# Patient Record
Sex: Female | Born: 1955 | Race: White | Hispanic: No | Marital: Married | Smoking: Never smoker
Health system: Southern US, Community
[De-identification: ages and names within clinical notes are randomized; demographics above are authoritative.]

## PROBLEM LIST (undated history)

## (undated) DIAGNOSIS — F32A Depression, unspecified: Secondary | ICD-10-CM

## (undated) DIAGNOSIS — F329 Major depressive disorder, single episode, unspecified: Secondary | ICD-10-CM

## (undated) DIAGNOSIS — I10 Essential (primary) hypertension: Secondary | ICD-10-CM

## (undated) DIAGNOSIS — T148XXA Other injury of unspecified body region, initial encounter: Secondary | ICD-10-CM

## (undated) DIAGNOSIS — H539 Unspecified visual disturbance: Secondary | ICD-10-CM

## (undated) HISTORY — DX: Unspecified visual disturbance: H53.9

## (undated) HISTORY — PX: CERVICAL FUSION: SHX112

## (undated) HISTORY — PX: REPLACEMENT TOTAL KNEE BILATERAL: SUR1225

---

## 2002-12-17 ENCOUNTER — Emergency Department (HOSPITAL_COMMUNITY): Admission: EM | Admit: 2002-12-17 | Discharge: 2002-12-17 | Payer: Self-pay

## 2006-11-04 ENCOUNTER — Encounter: Admission: RE | Admit: 2006-11-04 | Discharge: 2006-11-04 | Payer: Self-pay | Admitting: Family Medicine

## 2006-11-20 ENCOUNTER — Encounter: Admission: RE | Admit: 2006-11-20 | Discharge: 2006-11-20 | Payer: Self-pay | Admitting: Family Medicine

## 2007-01-09 ENCOUNTER — Observation Stay (HOSPITAL_COMMUNITY): Admission: RE | Admit: 2007-01-09 | Discharge: 2007-01-10 | Payer: Self-pay | Admitting: Neurosurgery

## 2007-01-28 ENCOUNTER — Encounter: Admission: RE | Admit: 2007-01-28 | Discharge: 2007-01-28 | Payer: Self-pay | Admitting: Neurosurgery

## 2007-04-14 ENCOUNTER — Encounter: Admission: RE | Admit: 2007-04-14 | Discharge: 2007-04-14 | Payer: Self-pay | Admitting: Endocrinology

## 2007-07-18 ENCOUNTER — Encounter: Admission: RE | Admit: 2007-07-18 | Discharge: 2007-07-18 | Payer: Self-pay | Admitting: Neurosurgery

## 2007-11-09 ENCOUNTER — Emergency Department (HOSPITAL_COMMUNITY): Admission: EM | Admit: 2007-11-09 | Discharge: 2007-11-09 | Payer: Self-pay | Admitting: Emergency Medicine

## 2007-11-26 ENCOUNTER — Encounter
Admission: RE | Admit: 2007-11-26 | Discharge: 2007-11-26 | Payer: Self-pay | Admitting: Physical Medicine and Rehabilitation

## 2007-12-01 ENCOUNTER — Encounter
Admission: RE | Admit: 2007-12-01 | Discharge: 2007-12-01 | Payer: Self-pay | Admitting: Physical Medicine and Rehabilitation

## 2008-09-13 ENCOUNTER — Encounter: Admission: RE | Admit: 2008-09-13 | Discharge: 2008-09-13 | Payer: Self-pay | Admitting: Endocrinology

## 2009-03-31 ENCOUNTER — Encounter: Admission: RE | Admit: 2009-03-31 | Discharge: 2009-03-31 | Payer: Self-pay | Admitting: Endocrinology

## 2009-05-02 ENCOUNTER — Encounter: Admission: RE | Admit: 2009-05-02 | Discharge: 2009-05-02 | Payer: Self-pay | Admitting: Internal Medicine

## 2009-06-06 ENCOUNTER — Encounter: Admission: RE | Admit: 2009-06-06 | Discharge: 2009-06-06 | Payer: Self-pay | Admitting: Internal Medicine

## 2010-04-10 ENCOUNTER — Encounter: Admission: RE | Admit: 2010-04-10 | Discharge: 2010-04-10 | Payer: Self-pay | Admitting: Endocrinology

## 2010-08-07 ENCOUNTER — Ambulatory Visit
Admission: RE | Admit: 2010-08-07 | Discharge: 2010-08-07 | Disposition: A | Discharge status: Home / Self Care | Payer: 59 | Source: Ambulatory Visit | Attending: Physical Medicine and Rehabilitation | Admitting: Physical Medicine and Rehabilitation

## 2010-08-07 ENCOUNTER — Other Ambulatory Visit: Payer: Self-pay | Admitting: Physical Medicine and Rehabilitation

## 2010-08-07 DIAGNOSIS — M25561 Pain in right knee: Secondary | ICD-10-CM

## 2010-11-07 NOTE — Op Note (Signed)
NAMELASHINA, Shelley Lloyd                  ACCOUNT NO.:  [id number]   MEDICAL RECORD NO.:  [id number]          PATIENT TYPE:  INP   LOCATION:  2899                         FACILITY:  [facility]   PHYSICIAN:  Reinaldo Meeker, M.D. DATE OF BIRTH:  1956/04/23   DATE OF PROCEDURE:  01/09/2007  DATE OF DISCHARGE:                               OPERATIVE REPORT   PREOPERATIVE DIAGNOSIS:  Herniated disk C4-5, left.   POSTOPERATIVE DIAGNOSIS:  Herniated disk C4-5, left.   PROCEDURE:  C4-5 anterior cervical diskectomy with bone bank fusion  followed by Nuvasive helix anterior cervical plating.   SURGEON:  Reinaldo Meeker, M.D.   ASSISTANT:  Dr. Julio Sicks.   PROCEDURE IN DETAIL:  Having been placed in the supine position in five  pounds halter traction, the patient's neck was prepped and draped in  usual sterile fashion.  Localizing fluoroscopy was used prior to  incision to identify the appropriate level.  Transverse incision was  made in the right anterior neck starting in the midline, headed towards  the medial aspect of the sternocleidomastoid muscle.  The platysma  muscle was incised transversely.  The natural fascial plane between the  strap muscles medially and sternocleidomastoid laterally was identified  and followed down to the anterior aspect of cervical spine.  Longus  colli muscles were identified, split in the midline, stripped away  bilaterally with the Optician, dispensing.  Self-retaining  retractor was placed for exposure.  X-rays showed approach to the  appropriate level.  Using Leksell rongeur, the bony overgrowth across  the disk space was removed.  Disk was then incised with the 15 blade.  Using pituitary rongeurs and curettes approximately 90% of disk material  was removed.  High-speed drill used to widen the interspace as well.  Microscope was draped, brought into the field, used for remainder of the  case.  Using microsection technique the remainder of disk  material bony,  overgrowth was removed down to the post longitudinal ligament.  The  ligament was then incised.  The cut edges were removed with the Kerrison  punch.  Thorough decompression was then carried out of the dural sac as  well as the proximal nerve roots, particularly on the left symptomatic  side.  At this time, inspection was carried out in all directions for  any evidence of residual compression and none could be identified.  Irrigation was carried out.  Measurements were taken and 7 mm bone bank  plugs reconstituted.  After irrigating once more and confirming  hemostasis, the plugs impacted without difficulty and fluoroscopy showed  them to be in good position.  Large amounts irrigation were then carried  out.  An appropriate length Helix anterior cervical plate was then  chosen.  Under fluoroscopic guidance drill holes were placed followed by  placement of 13 mm screws x4.  Final fluoroscopy showed the bony spacer,  plate, screws all be in good position.  Large amounts of irrigation were  carried out once more, any bleeding controlled with bipolar coagulation.  The wound was then closed with interrupted Vicryl  on the platysma  muscle, inverted 5-0 PDS on the subcuticular layer.  Steri-Strips on the  skin.  Sterile dressing was then applied and the patient was extubated,  taken to recovery room in stable condition.           ______________________________  Reinaldo Meeker, M.D.     ROK/MEDQ  D:  01/09/2007  T:  01/10/2007  Job:  [id number]

## 2010-11-28 ENCOUNTER — Other Ambulatory Visit: Payer: Self-pay | Admitting: Physical Medicine and Rehabilitation

## 2010-11-28 DIAGNOSIS — M542 Cervicalgia: Secondary | ICD-10-CM

## 2010-12-05 ENCOUNTER — Ambulatory Visit
Admission: RE | Admit: 2010-12-05 | Discharge: 2010-12-05 | Disposition: A | Discharge status: Home / Self Care | Payer: 59 | Source: Ambulatory Visit | Attending: Physical Medicine and Rehabilitation | Admitting: Physical Medicine and Rehabilitation

## 2010-12-05 DIAGNOSIS — M542 Cervicalgia: Secondary | ICD-10-CM

## 2011-04-03 ENCOUNTER — Other Ambulatory Visit (HOSPITAL_COMMUNITY): Payer: Self-pay | Admitting: Orthopedic Surgery

## 2011-04-03 ENCOUNTER — Encounter (HOSPITAL_COMMUNITY)
Admission: RE | Admit: 2011-04-03 | Discharge: 2011-04-03 | Disposition: A | Discharge status: Home / Self Care | Payer: 59 | Source: Ambulatory Visit | Attending: Orthopedic Surgery | Admitting: Orthopedic Surgery

## 2011-04-03 DIAGNOSIS — M1710 Unilateral primary osteoarthritis, unspecified knee: Secondary | ICD-10-CM

## 2011-04-03 LAB — URINALYSIS, ROUTINE W REFLEX MICROSCOPIC
Bilirubin Urine: NEGATIVE
Hgb urine dipstick: NEGATIVE
Ketones, ur: NEGATIVE mg/dL
Protein, ur: NEGATIVE mg/dL
Urobilinogen, UA: 0.2 mg/dL (ref 0.0–1.0)

## 2011-04-03 LAB — COMPREHENSIVE METABOLIC PANEL
ALT: 63 U/L — ABNORMAL HIGH (ref 0–35)
Albumin: 4.1 g/dL (ref 3.5–5.2)
Alkaline Phosphatase: 82 U/L (ref 39–117)
BUN: 17 mg/dL (ref 6–23)
Calcium: 9.6 mg/dL (ref 8.4–10.5)
Potassium: 4.7 mEq/L (ref 3.5–5.1)
Sodium: 139 mEq/L (ref 135–145)
Total Protein: 7.4 g/dL (ref 6.0–8.3)

## 2011-04-03 LAB — DIFFERENTIAL
Basophils Relative: 1 % (ref 0–1)
Eosinophils Relative: 2 % (ref 0–5)
Lymphocytes Relative: 30 % (ref 12–46)
Lymphs Abs: 2.1 10*3/uL (ref 0.7–4.0)
Monocytes Absolute: 0.7 10*3/uL (ref 0.1–1.0)
Monocytes Relative: 9 % (ref 3–12)
Neutro Abs: 4.1 10*3/uL (ref 1.7–7.7)
Neutrophils Relative %: 58 % (ref 43–77)

## 2011-04-03 LAB — CBC
HCT: 40.5 % (ref 36.0–46.0)
MCH: 30.3 pg (ref 26.0–34.0)
MCHC: 33.3 g/dL (ref 30.0–36.0)
MCV: 90.8 fL (ref 78.0–100.0)
RDW: 12.8 % (ref 11.5–15.5)

## 2011-04-03 LAB — SURGICAL PCR SCREEN: MRSA, PCR: NEGATIVE

## 2011-04-03 LAB — ABO/RH: ABO/RH(D): A POS

## 2011-04-04 LAB — URINE CULTURE: Culture: NO GROWTH

## 2011-04-09 ENCOUNTER — Inpatient Hospital Stay (HOSPITAL_COMMUNITY)
Admission: RE | Admit: 2011-04-09 | Discharge: 2011-04-12 | DRG: 462 | Disposition: A | Discharge status: Skilled Nursing Facility | Payer: 59 | Source: Ambulatory Visit | Attending: Orthopedic Surgery | Admitting: Orthopedic Surgery

## 2011-04-09 DIAGNOSIS — IMO0002 Reserved for concepts with insufficient information to code with codable children: Principal | ICD-10-CM | POA: Diagnosis present

## 2011-04-09 DIAGNOSIS — M1710 Unilateral primary osteoarthritis, unspecified knee: Principal | ICD-10-CM | POA: Diagnosis present

## 2011-04-09 DIAGNOSIS — D62 Acute posthemorrhagic anemia: Secondary | ICD-10-CM | POA: Diagnosis not present

## 2011-04-09 DIAGNOSIS — Z01818 Encounter for other preprocedural examination: Secondary | ICD-10-CM

## 2011-04-09 DIAGNOSIS — Z01812 Encounter for preprocedural laboratory examination: Secondary | ICD-10-CM

## 2011-04-09 DIAGNOSIS — B1910 Unspecified viral hepatitis B without hepatic coma: Secondary | ICD-10-CM | POA: Diagnosis present

## 2011-04-10 LAB — BASIC METABOLIC PANEL
BUN: 13
Chloride: 103
GFR calc Af Amer: >60
GFR calc non Af Amer: >60
Potassium: 3.9
Sodium: 138

## 2011-04-10 LAB — CBC
Hemoglobin: 8.8 g/dL — ABNORMAL LOW (ref 12.0–15.0)
Hemoglobin: 13.4
MCH: 29.5 pg (ref 26.0–34.0)
MCHC: 32.1 g/dL (ref 30.0–36.0)
MCHC: 34
MCV: 91.9 fL (ref 78.0–100.0)
RBC: 2.98 MIL/uL — ABNORMAL LOW (ref 3.87–5.11)
RDW: 12.5
WBC: 7.3

## 2011-04-10 LAB — COMPREHENSIVE METABOLIC PANEL
BUN: 6 mg/dL (ref 6–23)
CO2: 27 mEq/L (ref 19–32)
Calcium: 8 mg/dL — ABNORMAL LOW (ref 8.4–10.5)
Creatinine, Ser: 0.77 mg/dL (ref 0.50–1.10)
GFR calc Af Amer: >90 mL/min (ref >90)
GFR calc non Af Amer: >90 mL/min (ref >90)
Glucose, Bld: 109 mg/dL — ABNORMAL HIGH (ref 70–99)
Sodium: 137 mEq/L (ref 135–145)
Total Protein: 5.3 g/dL — ABNORMAL LOW (ref 6.0–8.3)

## 2011-04-11 LAB — COMPREHENSIVE METABOLIC PANEL
ALT: 21 U/L (ref 0–35)
AST: 24 U/L (ref 0–37)
Albumin: 2.4 g/dL — ABNORMAL LOW (ref 3.5–5.2)
CO2: 24 mEq/L (ref 19–32)
Calcium: 8 mg/dL — ABNORMAL LOW (ref 8.4–10.5)
GFR calc non Af Amer: 80 mL/min — ABNORMAL LOW (ref >90)
Sodium: 139 mEq/L (ref 135–145)

## 2011-04-11 LAB — CBC
MCH: 29.9 pg (ref 26.0–34.0)
Platelets: 97 10*3/uL — ABNORMAL LOW (ref 150–400)
RBC: 2.61 MIL/uL — ABNORMAL LOW (ref 3.87–5.11)
WBC: 9.2 10*3/uL (ref 4.0–10.5)

## 2011-04-12 LAB — TYPE AND SCREEN
Antibody Screen: NEGATIVE
Unit division: 0
Unit division: 0

## 2011-04-12 LAB — CBC
HCT: 28.5 % — ABNORMAL LOW (ref 36.0–46.0)
Hemoglobin: 9.5 g/dL — ABNORMAL LOW (ref 12.0–15.0)
MCH: 30 pg (ref 26.0–34.0)
MCV: 89.9 fL (ref 78.0–100.0)
RBC: 3.17 MIL/uL — ABNORMAL LOW (ref 3.87–5.11)

## 2011-04-12 LAB — COMPREHENSIVE METABOLIC PANEL
BUN: 6 mg/dL (ref 6–23)
CO2: 29 mEq/L (ref 19–32)
Calcium: 8.4 mg/dL (ref 8.4–10.5)
Creatinine, Ser: 0.7 mg/dL (ref 0.50–1.10)
GFR calc Af Amer: >90 mL/min (ref >90)
GFR calc non Af Amer: >90 mL/min (ref >90)
Glucose, Bld: 102 mg/dL — ABNORMAL HIGH (ref 70–99)
Total Bilirubin: 1.2 mg/dL (ref 0.3–1.2)

## 2011-04-19 NOTE — Op Note (Signed)
Shelley Lloyd, Shelley Lloyd NO.:  [id number]  MEDICAL RECORD NO.:  [id number]  LOCATION:  5040                         FACILITY:  [facility]  PHYSICIAN:  Elana Alm. Thurston Hole, M.D. DATE OF BIRTH:  06/24/1956  DATE OF PROCEDURE:  04/09/2011 DATE OF DISCHARGE:                              OPERATIVE REPORT   PREOPERATIVE DIAGNOSES: 1. Right knee degenerative joint disease. 2. Left knee degenerative joint disease.  POSTOPERATIVE DIAGNOSES: 1. Right knee degenerative joint disease. 2. Left knee degenerative joint disease.  PROCEDURE: 1. Right total knee replacement using DePuy cemented total knee system     with #3 cemented femur, #3 cemented tibia with 10-mm polyethylene     RP tibial spacer, and 35-mm polyethylene cemented patella. 2. Left total knee replacement using DePuy cemented total knee system     with #3 cemented femur, #3 cemented tibia with 12.5-mm polyethylene     RP tibial spacer and 35-mm polyethylene cemented patella. 3. Zinacef impregnated cement.  SURGEON:  Elana Alm. Thurston Hole, MD  ASSISTANT:  Julien Girt, PA-C  ANESTHESIA:  General.  OPERATIVE TIME:  Right knee 1 hour and 20 minutes, left knee 1 hour and 20 minutes.  COMPLICATIONS:  None.  DESCRIPTION OF PROCEDURE:  Ms. Weldin was brought to the operating room on April 09, 2011, after femoral nerve blocks were placed in both legs by Anesthesia.  She was placed on operative table in supine position.  She received Ancef 1 g IV preoperatively for prophylaxis.  After being placed under general anesthesia, she had a Foley catheter placed under sterile conditions.  Both knees were examined under anesthesia.  She had range of motion from -5 to 125 degrees bilaterally.  Both knees were stable to ligamentous exam with normal patellar tracking with mild varus deformities.  Both legs were then prepped using sterile DuraPrep and draped using sterile technique.  Time-out procedure was called and  both knees were identified as the correct knees.  Initially, the right knee underwent surgery.  The right leg was exsanguinated and a thigh tourniquet elevated to 365 mm.  Initially, through a 15-cm longitudinal incision based over the patella, initial exposure was made.  The underlying subcutaneous tissues were incised along with skin incision. A median arthrotomy was performed revealing an excessive amount of normal-appearing joint fluid.  The articular surfaces were inspected. She had grade 4 changes medially and in the patellofemoral joint and grade 3 changes laterally.  Osteophytes were removed from the femoral condyles and tibial plateau.  The medial and lateral meniscal remnants were removed as well as the anterior cruciate ligament.  Intramedullary drill was then drilled up the femoral canal for placement of distal femoral cutting jig which was placed in the appropriate manner of rotation and a distal 11-mm cut was made.  The distal femur was incised. #3 was found to be the appropriate size.  A #3 cutting jig was placed in the appropriate manner of external rotation and then these cuts were made.  Proximal tibia was then exposed.  The tibial spines were removed with an oscillating saw.  Intramedullary drill was drilled down the tibial canal for placement of proximal tibial cutting jig,  which was placed in appropriate manner of rotation and a proximal 6-mm cut was made based off the medial or lower side.  Spacer blocks were then placed in flexion and extension.  10-mm blocks gave excellent balancing, excellent stability, and excellent correction of her flexion and varus deformities.  #3 tibial baseplate trial was then placed on the cut tibial surface with an excellent fit and a keel cut was made.  The PCL box cutter was then placed on the distal femur and these cuts were made. At this point, the #3 femoral trial was placed and with a #3 tibial base plate trial and 10-mm  polyethylene RP tibial spacer, the knee was reduced, taken through range of motion from 0-125 degrees with excellent stability and excellent correction of her flexion and varus deformities and normal patellar tracking.  A resurfacing 9-mm cut was then made on the patella and 3 locking holes placed for a 35-mm polyethylene patellar trial, and again patellofemoral tracking was evaluated and found to be normal.  At this point, it is felt that all the trial components were of excellent size, fit, and stability.  They were then removed.  The knee was then jet lavage irrigated with 3 L of saline.  The proximal tibia was then exposed, #3 tibial base plate with Zinacef impregnated cement backing was hammered in position with an excellent fit with excess cement being removed from around the edges.  #3 femoral component with cement backing was hammered into position also with an excellent fit with excess cement being removed from around the edges.  The 10-mm polyethylene RP tibial spacer was placed on tibial base plate.  The knee reduced, taken through a range of motion from 0-125 degrees with excellent stability and excellent correction of her flexion and varus deformities.  The 35-mm polyethylene cement backed patella was then placed in its position and held there with a clamp.  After the cement hardened, again patellofemoral tracking was evaluated and found to be normal.  At this point, it was felt that all the components were of excellent size, fit, and stability.  The wound was further irrigated with saline and then the tourniquet was released.  Hemostasis was obtained with cautery.  The arthrotomy was then closed with #1 Ethibond suture over 2 medium Hemovac drains.  Subcutaneous tissues were closed with 0 and 2-0 Vicryl, subcuticular layer closed with 4-0 Monocryl. Sterile dressings were applied.  At this point, the patient was very stable on this and it was elected to proceed with a left  total knee replacement.  Left knee was exsanguinated and a thigh tourniquet elevated to 365 mm. At this point, a 15- mm longitudinal incision was based over the patella.  The underlying subcutaneous tissues were incised along with skin incision.  A median arthrotomy was performed revealing an excessive amount of normal-appearing joint fluid.  The articular surfaces were inspected.  She had grade 4 changes medially and in the patellofemoral joint and grade 3 changes noted on the lateral femoral condyle. Osteophytes were removed from the femoral condyles and tibial plateau. The medial and lateral meniscal remnants were removed as well as the anterior cruciate ligament.  Intramedullary drill was then drilled up the femoral canal for placement of the distal femoral cutting jig, which was placed in appropriate manner of rotation and a distal 11-mm cut was made.  The distal femur was incised.  #3 was found to be appropriate size.  #3 cutting jig was placed in the appropriate manner of  external rotation and then these cuts were made.  The proximal tibia was then exposed.  The tibial spines were removed with an oscillating saw. Intramedullary drill was then drilled d own the tibial canal for placement of proximal tibial cutting jig, which was placed in the appropriate manner of rotation and a proximal 6-mm cut was made based off the medial or lower side.  Spacer blocks were then placed in flexion and extension.  12.5 mm blocks gave excellent balancing, excellent stability, and excellent correction of her flexion and varus deformities.  #3 tibial baseplate trial was then placed on the cut tibial surface with an excellent fit and the keel cut was made.  The PCL box cutter was then placed on the distal femur and these cuts were made. At this point, with the #3 femoral trial in place, a #3 tibial base plate trial, and a 12.5-mm polyethylene RP tibial spacer, knee was reduced, taken through range of  motion from 0-125 degrees with excellent stability and excellent correction of her flexion and varus deformities and normal patellar tracking.  A resurfacing 9-mm cut was then made on the patella and 3 locking holes placed for a 35-mm polyethylene patellar trial, and again patellofemoral tracking was evaluated and found to be normal.  At this point, it was felt that all trial components were of excellent size, fit, and stability.  They were then removed.  The knee was then jet lavage irrigated with 3 L of saline.  The proximal tibia was then exposed, #3 tibial base plate with Zinacef impregnated cement backing was hammered in position with an excellent fit with excess cement being removed from around the edges.  #3 femoral component with cement backing was hammered in position also with an excellent fit with excess cement being removed from around the edges.  The 12.5-mm polyethylene RP tibial spacer was placed on tibial baseplate, the knee reduced, taken through a range of motion from 0-125 degrees with excellent stability and excellent correction of her flexion and varus deformities.  The 35-mm polyethylene cement backed patella was then placed in its position and held there with a clamp.  After the cement hardened, again patellofemoral tracking was evaluated and found to be normal.  At this point, it was felt that all the components were of excellent size, fit, and stability.  The wound was further irrigated with saline, and the tourniquet was released.  Hemostasis was obtained with cautery.  The arthrotomy was then closed with #1 Ethibond suture over 2 medium Hemovac drains.  Subcutaneous tissues were closed with 0 and 2-0 Vicryl.  Subcuticular layer closed with 4-0 Monocryl.  Sterile dressings were applied.  Long-leg splints were then placed on both legs. She was then awakened, extubated, and taken to the recovery room in stable condition.  Needle and sponge count was correct x2 at  the end of the case.  Neurovascular status normal.  Pulses 2+ and symmetric.     Costella Schwarz A. Thurston Hole, M.D.     RAW/MEDQ  D:  04/09/2011  T:  04/10/2011  Job:  [id number]  Electronically Signed by Salvatore Marvel M.D. on 04/19/2011 11:47:07 AM

## 2011-04-19 NOTE — Discharge Summary (Signed)
Shelley Lloyd, Shelley Lloyd NO.:  [id number]  MEDICAL RECORD NO.:  [id number]  LOCATION:  5040                         FACILITY:  [facility]  PHYSICIAN:  Elana Alm. Thurston Hole, M.D. DATE OF BIRTH:  [birth year]  DATE OF ADMISSION:  04/09/2011 DATE OF DISCHARGE:  04/12/2011                        DISCHARGE SUMMARY - REFERRING   ADMITTING DIAGNOSES: 1. End-stage degenerative joint disease both knees. 2. Hepatitis B. 3. Status post neck surgery x3 with a history of chronic pain.  DISCHARGE DIAGNOSES: 1. End-stage degenerative joint disease of both knees, status post     bilateral total knee replacements. 2. Acute blood loss anemia, status post bilateral Autovac transfusions     and 2 units of packed red blood cell transfusion. 3. Status post neck surgery x3. 4. Hepatitis B.  HISTORY OF PRESENT ILLNESS:  The patient is a 55 year old white female with a history of bilateral end-stage DJD.  She failed intraarticular cortisone injections, intraarticular Supartz injections, tramadol, and long-acting morphine.  She continued to have a significant amount of pain despite all of these conservative measures.  She elected to undergo bilateral total knee replacements.  Risks, benefits, and possible complications were discussed in detail with the patient.  PROCEDURES IN-HOUSE:  On April 09, 2011, the patient underwent bilateral total knee replacements by Dr. Thurston Hole, bilateral femoral nerve block by Anesthesia, and bilateral Autovac transfusion.  On April 11, 2011, the patient had 2 units of packed red blood cells.  HOSPITAL COURSE:  Following surgery, the patient was admitted for pain control, DVT prophylaxis, and physical therapy.  In PACU, she was placed in bilateral CPMs 0-90 degrees.  Postop day 1, the patient ambulated 10 feet with knee immobilizers on.  She had difficulty with pain control. We increased her from OxyContin 20 to OxyContin 40.  Postop day 2, she was still  having difficulty with pain control.  We decreased her to OxyContin 60.  Her hemoglobin went down to 7.8 on postop day 2.  She was transfused 2 units of packed red blood cells, and after transfusion, she no longer had any heart palpitations and had significantly more energy. Postop day 3, she was able to ambulate minimal assist out of bed and with supervision and no knee immobilizers to the bathroom and back. Surgical wounds were well approximated and healing.  She is being discharged to [facility].  She is weightbearing as tolerated.  She is on a regular diet.  She will need daily physical therapy and occupational therapy for ambulation, gait training, range of motion, strengthening.  She will need OT for ADLs.  She can shower starting Monday, the 22nd.  She is to wash both legs with soap and water, including her wounds washing well, rinsing well with soap and water.  Wounds get dressing changes daily with dry gauze.  Chlorhexidine wipes were sent to skilled facility with the patient, so the patient could wipe wounds with chlorhexidine wipes daily until she is allowed to shower on Monday.  ADMITTING LABORATORY DATA:  Hemoglobin was 13.5.  She was metabolically stable except for elevated liver functions of AST of 51, ALT of 63. These are attributed to her hepatitis B.  DISCHARGE LABORATORY DATA:  Her hemoglobin is 9.5 after Autovac transfusions and 2 units of packed red blood cells.  Her CMET is pending, but her CMET from April 11, 2011, was within normal limits with her AST being 24, her ALT being 21.  Her total protein and albumin were low 0 with 5.2 for total protein and albumin of 2.4.  Her calcium is slightly low at 8.0.  DISCHARGE MEDICATIONS: 1. Celebrex 200 mg 1 tablet daily with a meal. 2. Colace 100 mg 1 tablet daily. 3. Lovenox 30 mg subcu q.12 h. 4. OxyContin sustained release 60 mg 1 tablet every 12 hours.  She has     been doing this at 10:00 a.m. and  10:00 p.m. 5. Oxycodone 15 mg 1 tablet every 3 hours as needed for breakthrough     pain. 6. Synthroid 75 mcg daily. She has been instructed to stop her tramadol, to stop her Ambien 12.5 mg, and to stop her Vivelle patch.  While in the hospital, she was given 5 mg Ambien.  She had some confusion and pulled out her IV, so we do not feel that she is a good candidate for Ambien for sleep.  She needs to follow up with Dr. Thurston Hole in the office on April 23, 2011.     Kirstin Shepperson, PA-C   ______________________________ Elana Alm Thurston Hole, M.D.    KS/MEDQ  D:  04/12/2011  T:  04/12/2011  Job:  [id number]  cc:   Dr. Ronne Binning  Electronically Signed by Julien Girt P.A. on 04/17/2011 04:45:54 PM Electronically Signed by Salvatore Marvel M.D. on 04/19/2011 11:47:11 AM

## 2012-08-21 ENCOUNTER — Other Ambulatory Visit: Payer: Self-pay

## 2012-08-21 DIAGNOSIS — Z1231 Encounter for screening mammogram for malignant neoplasm of breast: Secondary | ICD-10-CM

## 2012-09-05 ENCOUNTER — Other Ambulatory Visit: Payer: Self-pay | Admitting: Internal Medicine

## 2012-09-05 ENCOUNTER — Ambulatory Visit
Admission: RE | Admit: 2012-09-05 | Discharge: 2012-09-05 | Disposition: A | Discharge status: Home / Self Care | Payer: 59 | Source: Ambulatory Visit

## 2012-09-05 DIAGNOSIS — Z1231 Encounter for screening mammogram for malignant neoplasm of breast: Secondary | ICD-10-CM

## 2012-09-08 ENCOUNTER — Other Ambulatory Visit: Payer: Self-pay | Admitting: Internal Medicine

## 2012-09-08 DIAGNOSIS — R928 Other abnormal and inconclusive findings on diagnostic imaging of breast: Secondary | ICD-10-CM

## 2012-09-19 ENCOUNTER — Ambulatory Visit
Admission: RE | Admit: 2012-09-19 | Discharge: 2012-09-19 | Disposition: A | Discharge status: Home / Self Care | Payer: 59 | Source: Ambulatory Visit | Attending: Internal Medicine | Admitting: Internal Medicine

## 2012-09-19 DIAGNOSIS — R928 Other abnormal and inconclusive findings on diagnostic imaging of breast: Secondary | ICD-10-CM

## 2012-11-05 ENCOUNTER — Other Ambulatory Visit: Payer: Self-pay | Admitting: Endocrinology

## 2012-11-05 DIAGNOSIS — E039 Hypothyroidism, unspecified: Secondary | ICD-10-CM

## 2012-11-06 ENCOUNTER — Ambulatory Visit
Admission: RE | Admit: 2012-11-06 | Discharge: 2012-11-06 | Disposition: A | Discharge status: Home / Self Care | Payer: 59 | Source: Ambulatory Visit | Attending: Endocrinology | Admitting: Endocrinology

## 2012-11-06 DIAGNOSIS — E039 Hypothyroidism, unspecified: Secondary | ICD-10-CM

## 2013-04-13 ENCOUNTER — Ambulatory Visit
Admission: RE | Admit: 2013-04-13 | Discharge: 2013-04-13 | Disposition: A | Discharge status: Home / Self Care | Payer: 59 | Source: Ambulatory Visit | Attending: Osteopathic Medicine | Admitting: Osteopathic Medicine

## 2013-04-13 ENCOUNTER — Other Ambulatory Visit: Payer: Self-pay | Admitting: Osteopathic Medicine

## 2013-04-13 DIAGNOSIS — R52 Pain, unspecified: Secondary | ICD-10-CM

## 2013-10-01 ENCOUNTER — Other Ambulatory Visit: Payer: Self-pay

## 2013-10-01 DIAGNOSIS — Z1231 Encounter for screening mammogram for malignant neoplasm of breast: Secondary | ICD-10-CM

## 2013-10-20 ENCOUNTER — Encounter (INDEPENDENT_AMBULATORY_CARE_PROVIDER_SITE_OTHER): Payer: Self-pay

## 2013-10-20 ENCOUNTER — Ambulatory Visit
Admission: RE | Admit: 2013-10-20 | Discharge: 2013-10-20 | Disposition: A | Discharge status: Home / Self Care | Payer: 59 | Source: Ambulatory Visit

## 2013-10-20 DIAGNOSIS — Z1231 Encounter for screening mammogram for malignant neoplasm of breast: Secondary | ICD-10-CM

## 2014-04-06 ENCOUNTER — Other Ambulatory Visit: Payer: Self-pay | Admitting: Pain Medicine

## 2014-04-06 DIAGNOSIS — M542 Cervicalgia: Secondary | ICD-10-CM

## 2014-04-06 DIAGNOSIS — M25511 Pain in right shoulder: Secondary | ICD-10-CM

## 2014-04-06 DIAGNOSIS — M25512 Pain in left shoulder: Principal | ICD-10-CM

## 2014-04-13 ENCOUNTER — Other Ambulatory Visit: Payer: 59

## 2014-04-13 ENCOUNTER — Ambulatory Visit
Admission: RE | Admit: 2014-04-13 | Discharge: 2014-04-13 | Disposition: A | Discharge status: Home / Self Care | Payer: 59 | Source: Ambulatory Visit | Attending: Pain Medicine | Admitting: Pain Medicine

## 2014-04-13 DIAGNOSIS — M542 Cervicalgia: Secondary | ICD-10-CM

## 2015-01-04 ENCOUNTER — Other Ambulatory Visit: Payer: Self-pay

## 2015-01-04 DIAGNOSIS — Z1231 Encounter for screening mammogram for malignant neoplasm of breast: Secondary | ICD-10-CM

## 2015-02-09 ENCOUNTER — Ambulatory Visit
Admission: RE | Admit: 2015-02-09 | Discharge: 2015-02-09 | Disposition: A | Discharge status: Home / Self Care | Payer: 59 | Source: Ambulatory Visit

## 2015-02-09 DIAGNOSIS — Z1231 Encounter for screening mammogram for malignant neoplasm of breast: Secondary | ICD-10-CM

## 2015-02-16 ENCOUNTER — Other Ambulatory Visit: Payer: Self-pay | Admitting: Pain Medicine

## 2015-02-16 DIAGNOSIS — M542 Cervicalgia: Secondary | ICD-10-CM

## 2015-02-23 ENCOUNTER — Ambulatory Visit
Admission: RE | Admit: 2015-02-23 | Discharge: 2015-02-23 | Disposition: A | Discharge status: Home / Self Care | Payer: 59 | Source: Ambulatory Visit | Attending: Pain Medicine | Admitting: Pain Medicine

## 2015-02-23 DIAGNOSIS — M542 Cervicalgia: Secondary | ICD-10-CM

## 2016-03-15 ENCOUNTER — Other Ambulatory Visit: Payer: Self-pay | Admitting: Internal Medicine

## 2016-03-15 DIAGNOSIS — Z1231 Encounter for screening mammogram for malignant neoplasm of breast: Secondary | ICD-10-CM

## 2016-03-26 ENCOUNTER — Ambulatory Visit
Admission: RE | Admit: 2016-03-26 | Discharge: 2016-03-26 | Disposition: A | Discharge status: Home / Self Care | Payer: 59 | Source: Ambulatory Visit | Attending: Internal Medicine | Admitting: Internal Medicine

## 2016-03-26 DIAGNOSIS — Z1231 Encounter for screening mammogram for malignant neoplasm of breast: Secondary | ICD-10-CM

## 2016-07-18 DIAGNOSIS — G894 Chronic pain syndrome: Secondary | ICD-10-CM | POA: Diagnosis not present

## 2016-07-18 DIAGNOSIS — M47812 Spondylosis without myelopathy or radiculopathy, cervical region: Secondary | ICD-10-CM | POA: Diagnosis not present

## 2016-07-18 DIAGNOSIS — Z79899 Other long term (current) drug therapy: Secondary | ICD-10-CM | POA: Diagnosis not present

## 2016-07-18 DIAGNOSIS — Z79891 Long term (current) use of opiate analgesic: Secondary | ICD-10-CM | POA: Diagnosis not present

## 2016-07-18 DIAGNOSIS — M5030 Other cervical disc degeneration, unspecified cervical region: Secondary | ICD-10-CM | POA: Diagnosis not present

## 2016-07-26 ENCOUNTER — Other Ambulatory Visit: Payer: Self-pay | Admitting: Internal Medicine

## 2016-07-26 DIAGNOSIS — N644 Mastodynia: Secondary | ICD-10-CM | POA: Diagnosis not present

## 2016-07-30 ENCOUNTER — Ambulatory Visit
Admission: RE | Admit: 2016-07-30 | Discharge: 2016-07-30 | Disposition: A | Discharge status: Home / Self Care | Payer: 59 | Source: Ambulatory Visit | Attending: Internal Medicine | Admitting: Internal Medicine

## 2016-07-30 DIAGNOSIS — N6312 Unspecified lump in the right breast, upper inner quadrant: Secondary | ICD-10-CM | POA: Diagnosis not present

## 2016-07-30 DIAGNOSIS — N644 Mastodynia: Secondary | ICD-10-CM

## 2016-08-15 DIAGNOSIS — Z79891 Long term (current) use of opiate analgesic: Secondary | ICD-10-CM | POA: Diagnosis not present

## 2016-08-15 DIAGNOSIS — M47817 Spondylosis without myelopathy or radiculopathy, lumbosacral region: Secondary | ICD-10-CM | POA: Diagnosis not present

## 2016-08-15 DIAGNOSIS — M47812 Spondylosis without myelopathy or radiculopathy, cervical region: Secondary | ICD-10-CM | POA: Diagnosis not present

## 2016-08-15 DIAGNOSIS — Z79899 Other long term (current) drug therapy: Secondary | ICD-10-CM | POA: Diagnosis not present

## 2016-08-15 DIAGNOSIS — M5030 Other cervical disc degeneration, unspecified cervical region: Secondary | ICD-10-CM | POA: Diagnosis not present

## 2016-08-15 DIAGNOSIS — G894 Chronic pain syndrome: Secondary | ICD-10-CM | POA: Diagnosis not present

## 2016-09-12 DIAGNOSIS — M47812 Spondylosis without myelopathy or radiculopathy, cervical region: Secondary | ICD-10-CM | POA: Diagnosis not present

## 2016-09-12 DIAGNOSIS — G894 Chronic pain syndrome: Secondary | ICD-10-CM | POA: Diagnosis not present

## 2016-09-12 DIAGNOSIS — Z79899 Other long term (current) drug therapy: Secondary | ICD-10-CM | POA: Diagnosis not present

## 2016-09-12 DIAGNOSIS — M5030 Other cervical disc degeneration, unspecified cervical region: Secondary | ICD-10-CM | POA: Diagnosis not present

## 2016-09-12 DIAGNOSIS — Z79891 Long term (current) use of opiate analgesic: Secondary | ICD-10-CM | POA: Diagnosis not present

## 2016-10-01 DIAGNOSIS — M47817 Spondylosis without myelopathy or radiculopathy, lumbosacral region: Secondary | ICD-10-CM | POA: Diagnosis not present

## 2016-10-15 DIAGNOSIS — M47817 Spondylosis without myelopathy or radiculopathy, lumbosacral region: Secondary | ICD-10-CM | POA: Diagnosis not present

## 2016-11-13 DIAGNOSIS — M47812 Spondylosis without myelopathy or radiculopathy, cervical region: Secondary | ICD-10-CM | POA: Diagnosis not present

## 2016-11-13 DIAGNOSIS — M5030 Other cervical disc degeneration, unspecified cervical region: Secondary | ICD-10-CM | POA: Diagnosis not present

## 2016-11-13 DIAGNOSIS — G894 Chronic pain syndrome: Secondary | ICD-10-CM | POA: Diagnosis not present

## 2016-11-13 DIAGNOSIS — Z79899 Other long term (current) drug therapy: Secondary | ICD-10-CM | POA: Diagnosis not present

## 2016-11-13 DIAGNOSIS — Z79891 Long term (current) use of opiate analgesic: Secondary | ICD-10-CM | POA: Diagnosis not present

## 2016-11-15 DIAGNOSIS — E559 Vitamin D deficiency, unspecified: Secondary | ICD-10-CM | POA: Diagnosis not present

## 2016-11-15 DIAGNOSIS — I10 Essential (primary) hypertension: Secondary | ICD-10-CM | POA: Diagnosis not present

## 2016-11-15 DIAGNOSIS — Z0000 Encounter for general adult medical examination without abnormal findings: Secondary | ICD-10-CM | POA: Diagnosis not present

## 2016-11-20 DIAGNOSIS — I10 Essential (primary) hypertension: Secondary | ICD-10-CM | POA: Diagnosis not present

## 2016-11-20 DIAGNOSIS — E039 Hypothyroidism, unspecified: Secondary | ICD-10-CM | POA: Diagnosis not present

## 2016-11-20 DIAGNOSIS — R7989 Other specified abnormal findings of blood chemistry: Secondary | ICD-10-CM | POA: Diagnosis not present

## 2016-11-20 DIAGNOSIS — Z0000 Encounter for general adult medical examination without abnormal findings: Secondary | ICD-10-CM | POA: Diagnosis not present

## 2016-12-11 DIAGNOSIS — Z79899 Other long term (current) drug therapy: Secondary | ICD-10-CM | POA: Diagnosis not present

## 2016-12-11 DIAGNOSIS — G894 Chronic pain syndrome: Secondary | ICD-10-CM | POA: Diagnosis not present

## 2016-12-11 DIAGNOSIS — Z79891 Long term (current) use of opiate analgesic: Secondary | ICD-10-CM | POA: Diagnosis not present

## 2016-12-11 DIAGNOSIS — M47812 Spondylosis without myelopathy or radiculopathy, cervical region: Secondary | ICD-10-CM | POA: Diagnosis not present

## 2016-12-11 DIAGNOSIS — M5030 Other cervical disc degeneration, unspecified cervical region: Secondary | ICD-10-CM | POA: Diagnosis not present

## 2017-01-08 DIAGNOSIS — G894 Chronic pain syndrome: Secondary | ICD-10-CM | POA: Diagnosis not present

## 2017-01-08 DIAGNOSIS — Z79899 Other long term (current) drug therapy: Secondary | ICD-10-CM | POA: Diagnosis not present

## 2017-01-08 DIAGNOSIS — M5030 Other cervical disc degeneration, unspecified cervical region: Secondary | ICD-10-CM | POA: Diagnosis not present

## 2017-01-08 DIAGNOSIS — Z79891 Long term (current) use of opiate analgesic: Secondary | ICD-10-CM | POA: Diagnosis not present

## 2017-01-08 DIAGNOSIS — M47812 Spondylosis without myelopathy or radiculopathy, cervical region: Secondary | ICD-10-CM | POA: Diagnosis not present

## 2017-01-29 DIAGNOSIS — M47812 Spondylosis without myelopathy or radiculopathy, cervical region: Secondary | ICD-10-CM | POA: Diagnosis not present

## 2017-01-29 DIAGNOSIS — Z79899 Other long term (current) drug therapy: Secondary | ICD-10-CM | POA: Diagnosis not present

## 2017-01-29 DIAGNOSIS — G894 Chronic pain syndrome: Secondary | ICD-10-CM | POA: Diagnosis not present

## 2017-01-29 DIAGNOSIS — Z79891 Long term (current) use of opiate analgesic: Secondary | ICD-10-CM | POA: Diagnosis not present

## 2017-01-29 DIAGNOSIS — M5030 Other cervical disc degeneration, unspecified cervical region: Secondary | ICD-10-CM | POA: Diagnosis not present

## 2017-02-27 DIAGNOSIS — M5030 Other cervical disc degeneration, unspecified cervical region: Secondary | ICD-10-CM | POA: Diagnosis not present

## 2017-02-27 DIAGNOSIS — M47812 Spondylosis without myelopathy or radiculopathy, cervical region: Secondary | ICD-10-CM | POA: Diagnosis not present

## 2017-02-27 DIAGNOSIS — G894 Chronic pain syndrome: Secondary | ICD-10-CM | POA: Diagnosis not present

## 2017-03-26 ENCOUNTER — Other Ambulatory Visit: Payer: Self-pay | Admitting: Internal Medicine

## 2017-03-26 DIAGNOSIS — Z1231 Encounter for screening mammogram for malignant neoplasm of breast: Secondary | ICD-10-CM

## 2017-03-27 DIAGNOSIS — M47812 Spondylosis without myelopathy or radiculopathy, cervical region: Secondary | ICD-10-CM | POA: Diagnosis not present

## 2017-03-27 DIAGNOSIS — G894 Chronic pain syndrome: Secondary | ICD-10-CM | POA: Diagnosis not present

## 2017-03-27 DIAGNOSIS — Z79899 Other long term (current) drug therapy: Secondary | ICD-10-CM | POA: Diagnosis not present

## 2017-03-27 DIAGNOSIS — M5030 Other cervical disc degeneration, unspecified cervical region: Secondary | ICD-10-CM | POA: Diagnosis not present

## 2017-03-27 DIAGNOSIS — Z79891 Long term (current) use of opiate analgesic: Secondary | ICD-10-CM | POA: Diagnosis not present

## 2017-03-28 ENCOUNTER — Ambulatory Visit
Admission: RE | Admit: 2017-03-28 | Discharge: 2017-03-28 | Disposition: A | Discharge status: Home / Self Care | Payer: 59 | Source: Ambulatory Visit | Attending: Internal Medicine | Admitting: Internal Medicine

## 2017-03-28 DIAGNOSIS — Z1231 Encounter for screening mammogram for malignant neoplasm of breast: Secondary | ICD-10-CM

## 2017-04-11 DIAGNOSIS — Z23 Encounter for immunization: Secondary | ICD-10-CM | POA: Diagnosis not present

## 2017-04-24 DIAGNOSIS — G894 Chronic pain syndrome: Secondary | ICD-10-CM | POA: Diagnosis not present

## 2017-04-24 DIAGNOSIS — M47812 Spondylosis without myelopathy or radiculopathy, cervical region: Secondary | ICD-10-CM | POA: Diagnosis not present

## 2017-04-24 DIAGNOSIS — M5030 Other cervical disc degeneration, unspecified cervical region: Secondary | ICD-10-CM | POA: Diagnosis not present

## 2017-04-24 DIAGNOSIS — Z79891 Long term (current) use of opiate analgesic: Secondary | ICD-10-CM | POA: Diagnosis not present

## 2017-04-24 DIAGNOSIS — Z79899 Other long term (current) drug therapy: Secondary | ICD-10-CM | POA: Diagnosis not present

## 2017-05-22 DIAGNOSIS — M47812 Spondylosis without myelopathy or radiculopathy, cervical region: Secondary | ICD-10-CM | POA: Diagnosis not present

## 2017-05-22 DIAGNOSIS — M5030 Other cervical disc degeneration, unspecified cervical region: Secondary | ICD-10-CM | POA: Diagnosis not present

## 2017-05-22 DIAGNOSIS — G894 Chronic pain syndrome: Secondary | ICD-10-CM | POA: Diagnosis not present

## 2017-05-23 DIAGNOSIS — I10 Essential (primary) hypertension: Secondary | ICD-10-CM | POA: Diagnosis not present

## 2017-05-23 DIAGNOSIS — E559 Vitamin D deficiency, unspecified: Secondary | ICD-10-CM | POA: Diagnosis not present

## 2017-06-19 DIAGNOSIS — H168 Other keratitis: Secondary | ICD-10-CM | POA: Diagnosis not present

## 2017-06-21 DIAGNOSIS — H168 Other keratitis: Secondary | ICD-10-CM | POA: Diagnosis not present

## 2017-06-26 DIAGNOSIS — Z79899 Other long term (current) drug therapy: Secondary | ICD-10-CM | POA: Diagnosis not present

## 2017-06-26 DIAGNOSIS — G894 Chronic pain syndrome: Secondary | ICD-10-CM | POA: Diagnosis not present

## 2017-06-26 DIAGNOSIS — M47812 Spondylosis without myelopathy or radiculopathy, cervical region: Secondary | ICD-10-CM | POA: Diagnosis not present

## 2017-06-26 DIAGNOSIS — M5030 Other cervical disc degeneration, unspecified cervical region: Secondary | ICD-10-CM | POA: Diagnosis not present

## 2017-06-26 DIAGNOSIS — Z79891 Long term (current) use of opiate analgesic: Secondary | ICD-10-CM | POA: Diagnosis not present

## 2017-06-27 DIAGNOSIS — H168 Other keratitis: Secondary | ICD-10-CM | POA: Diagnosis not present

## 2017-07-24 DIAGNOSIS — M47812 Spondylosis without myelopathy or radiculopathy, cervical region: Secondary | ICD-10-CM | POA: Diagnosis not present

## 2017-07-24 DIAGNOSIS — M5030 Other cervical disc degeneration, unspecified cervical region: Secondary | ICD-10-CM | POA: Diagnosis not present

## 2017-07-24 DIAGNOSIS — G894 Chronic pain syndrome: Secondary | ICD-10-CM | POA: Diagnosis not present

## 2017-08-21 DIAGNOSIS — G894 Chronic pain syndrome: Secondary | ICD-10-CM | POA: Diagnosis not present

## 2017-08-21 DIAGNOSIS — G5600 Carpal tunnel syndrome, unspecified upper limb: Secondary | ICD-10-CM | POA: Diagnosis not present

## 2017-08-21 DIAGNOSIS — M47812 Spondylosis without myelopathy or radiculopathy, cervical region: Secondary | ICD-10-CM | POA: Diagnosis not present

## 2017-09-10 ENCOUNTER — Ambulatory Visit
Admission: RE | Admit: 2017-09-10 | Discharge: 2017-09-10 | Disposition: A | Discharge status: Home / Self Care | Payer: 59 | Source: Ambulatory Visit | Attending: Physician Assistant | Admitting: Physician Assistant

## 2017-09-10 ENCOUNTER — Other Ambulatory Visit: Payer: Self-pay | Admitting: Physician Assistant

## 2017-09-10 DIAGNOSIS — M542 Cervicalgia: Secondary | ICD-10-CM

## 2017-09-18 DIAGNOSIS — Z79899 Other long term (current) drug therapy: Secondary | ICD-10-CM | POA: Diagnosis not present

## 2017-09-18 DIAGNOSIS — M47812 Spondylosis without myelopathy or radiculopathy, cervical region: Secondary | ICD-10-CM | POA: Diagnosis not present

## 2017-09-18 DIAGNOSIS — M5030 Other cervical disc degeneration, unspecified cervical region: Secondary | ICD-10-CM | POA: Diagnosis not present

## 2017-09-18 DIAGNOSIS — G894 Chronic pain syndrome: Secondary | ICD-10-CM | POA: Diagnosis not present

## 2017-09-18 DIAGNOSIS — G5600 Carpal tunnel syndrome, unspecified upper limb: Secondary | ICD-10-CM | POA: Diagnosis not present

## 2017-09-18 DIAGNOSIS — Z79891 Long term (current) use of opiate analgesic: Secondary | ICD-10-CM | POA: Diagnosis not present

## 2017-10-16 DIAGNOSIS — M47812 Spondylosis without myelopathy or radiculopathy, cervical region: Secondary | ICD-10-CM | POA: Diagnosis not present

## 2017-10-16 DIAGNOSIS — G894 Chronic pain syndrome: Secondary | ICD-10-CM | POA: Diagnosis not present

## 2017-10-16 DIAGNOSIS — G5600 Carpal tunnel syndrome, unspecified upper limb: Secondary | ICD-10-CM | POA: Diagnosis not present

## 2017-11-13 DIAGNOSIS — Z79891 Long term (current) use of opiate analgesic: Secondary | ICD-10-CM | POA: Diagnosis not present

## 2017-11-13 DIAGNOSIS — M5030 Other cervical disc degeneration, unspecified cervical region: Secondary | ICD-10-CM | POA: Diagnosis not present

## 2017-11-13 DIAGNOSIS — M47816 Spondylosis without myelopathy or radiculopathy, lumbar region: Secondary | ICD-10-CM | POA: Diagnosis not present

## 2017-11-13 DIAGNOSIS — Z79899 Other long term (current) drug therapy: Secondary | ICD-10-CM | POA: Diagnosis not present

## 2017-11-13 DIAGNOSIS — G894 Chronic pain syndrome: Secondary | ICD-10-CM | POA: Diagnosis not present

## 2017-11-20 DIAGNOSIS — E039 Hypothyroidism, unspecified: Secondary | ICD-10-CM | POA: Diagnosis not present

## 2017-11-20 DIAGNOSIS — I10 Essential (primary) hypertension: Secondary | ICD-10-CM | POA: Diagnosis not present

## 2017-11-20 DIAGNOSIS — E559 Vitamin D deficiency, unspecified: Secondary | ICD-10-CM | POA: Diagnosis not present

## 2017-11-25 DIAGNOSIS — I10 Essential (primary) hypertension: Secondary | ICD-10-CM | POA: Diagnosis not present

## 2017-11-25 DIAGNOSIS — Z0000 Encounter for general adult medical examination without abnormal findings: Secondary | ICD-10-CM | POA: Diagnosis not present

## 2017-11-25 DIAGNOSIS — M722 Plantar fascial fibromatosis: Secondary | ICD-10-CM | POA: Diagnosis not present

## 2017-12-02 DIAGNOSIS — M25572 Pain in left ankle and joints of left foot: Secondary | ICD-10-CM | POA: Diagnosis not present

## 2017-12-02 DIAGNOSIS — M25571 Pain in right ankle and joints of right foot: Secondary | ICD-10-CM | POA: Diagnosis not present

## 2017-12-10 DIAGNOSIS — G894 Chronic pain syndrome: Secondary | ICD-10-CM | POA: Diagnosis not present

## 2017-12-10 DIAGNOSIS — M47816 Spondylosis without myelopathy or radiculopathy, lumbar region: Secondary | ICD-10-CM | POA: Diagnosis not present

## 2017-12-10 DIAGNOSIS — M47812 Spondylosis without myelopathy or radiculopathy, cervical region: Secondary | ICD-10-CM | POA: Diagnosis not present

## 2018-01-14 DIAGNOSIS — M545 Low back pain: Secondary | ICD-10-CM | POA: Diagnosis not present

## 2018-01-14 DIAGNOSIS — G894 Chronic pain syndrome: Secondary | ICD-10-CM | POA: Diagnosis not present

## 2018-01-14 DIAGNOSIS — M47816 Spondylosis without myelopathy or radiculopathy, lumbar region: Secondary | ICD-10-CM | POA: Diagnosis not present

## 2018-02-18 DIAGNOSIS — M47816 Spondylosis without myelopathy or radiculopathy, lumbar region: Secondary | ICD-10-CM | POA: Diagnosis not present

## 2018-02-18 DIAGNOSIS — G894 Chronic pain syndrome: Secondary | ICD-10-CM | POA: Diagnosis not present

## 2018-02-18 DIAGNOSIS — Z79891 Long term (current) use of opiate analgesic: Secondary | ICD-10-CM | POA: Diagnosis not present

## 2018-02-18 DIAGNOSIS — M5030 Other cervical disc degeneration, unspecified cervical region: Secondary | ICD-10-CM | POA: Diagnosis not present

## 2018-02-18 DIAGNOSIS — Z79899 Other long term (current) drug therapy: Secondary | ICD-10-CM | POA: Diagnosis not present

## 2018-02-25 ENCOUNTER — Other Ambulatory Visit: Payer: Self-pay | Admitting: Pain Medicine

## 2018-02-25 DIAGNOSIS — M542 Cervicalgia: Secondary | ICD-10-CM

## 2018-03-04 ENCOUNTER — Ambulatory Visit
Admission: RE | Admit: 2018-03-04 | Discharge: 2018-03-04 | Disposition: A | Discharge status: Home / Self Care | Payer: 59 | Source: Ambulatory Visit | Attending: Pain Medicine | Admitting: Pain Medicine

## 2018-03-04 DIAGNOSIS — M542 Cervicalgia: Secondary | ICD-10-CM

## 2018-03-04 DIAGNOSIS — M4802 Spinal stenosis, cervical region: Secondary | ICD-10-CM | POA: Diagnosis not present

## 2018-03-18 DIAGNOSIS — G894 Chronic pain syndrome: Secondary | ICD-10-CM | POA: Diagnosis not present

## 2018-03-18 DIAGNOSIS — M5030 Other cervical disc degeneration, unspecified cervical region: Secondary | ICD-10-CM | POA: Diagnosis not present

## 2018-03-18 DIAGNOSIS — M47816 Spondylosis without myelopathy or radiculopathy, lumbar region: Secondary | ICD-10-CM | POA: Diagnosis not present

## 2018-04-15 DIAGNOSIS — G894 Chronic pain syndrome: Secondary | ICD-10-CM | POA: Diagnosis not present

## 2018-04-15 DIAGNOSIS — Z79899 Other long term (current) drug therapy: Secondary | ICD-10-CM | POA: Diagnosis not present

## 2018-04-15 DIAGNOSIS — M5030 Other cervical disc degeneration, unspecified cervical region: Secondary | ICD-10-CM | POA: Diagnosis not present

## 2018-04-15 DIAGNOSIS — Z79891 Long term (current) use of opiate analgesic: Secondary | ICD-10-CM | POA: Diagnosis not present

## 2018-04-15 DIAGNOSIS — M47816 Spondylosis without myelopathy or radiculopathy, lumbar region: Secondary | ICD-10-CM | POA: Diagnosis not present

## 2018-05-09 ENCOUNTER — Other Ambulatory Visit: Payer: Self-pay | Admitting: Internal Medicine

## 2018-05-09 DIAGNOSIS — Z1231 Encounter for screening mammogram for malignant neoplasm of breast: Secondary | ICD-10-CM

## 2018-05-13 DIAGNOSIS — M5030 Other cervical disc degeneration, unspecified cervical region: Secondary | ICD-10-CM | POA: Diagnosis not present

## 2018-05-13 DIAGNOSIS — M47816 Spondylosis without myelopathy or radiculopathy, lumbar region: Secondary | ICD-10-CM | POA: Diagnosis not present

## 2018-05-13 DIAGNOSIS — G894 Chronic pain syndrome: Secondary | ICD-10-CM | POA: Diagnosis not present

## 2018-05-26 DIAGNOSIS — E039 Hypothyroidism, unspecified: Secondary | ICD-10-CM | POA: Diagnosis not present

## 2018-05-26 DIAGNOSIS — Z23 Encounter for immunization: Secondary | ICD-10-CM | POA: Diagnosis not present

## 2018-05-26 DIAGNOSIS — I10 Essential (primary) hypertension: Secondary | ICD-10-CM | POA: Diagnosis not present

## 2018-06-10 DIAGNOSIS — M47816 Spondylosis without myelopathy or radiculopathy, lumbar region: Secondary | ICD-10-CM | POA: Diagnosis not present

## 2018-06-10 DIAGNOSIS — G894 Chronic pain syndrome: Secondary | ICD-10-CM | POA: Diagnosis not present

## 2018-06-10 DIAGNOSIS — M5030 Other cervical disc degeneration, unspecified cervical region: Secondary | ICD-10-CM | POA: Diagnosis not present

## 2018-06-24 ENCOUNTER — Ambulatory Visit
Admission: RE | Admit: 2018-06-24 | Discharge: 2018-06-24 | Disposition: A | Discharge status: Home / Self Care | Payer: 59 | Source: Ambulatory Visit | Attending: Internal Medicine | Admitting: Internal Medicine

## 2018-06-24 DIAGNOSIS — Z1231 Encounter for screening mammogram for malignant neoplasm of breast: Secondary | ICD-10-CM

## 2018-07-10 DIAGNOSIS — Z79899 Other long term (current) drug therapy: Secondary | ICD-10-CM | POA: Diagnosis not present

## 2018-07-10 DIAGNOSIS — Z79891 Long term (current) use of opiate analgesic: Secondary | ICD-10-CM | POA: Diagnosis not present

## 2018-07-10 DIAGNOSIS — G894 Chronic pain syndrome: Secondary | ICD-10-CM | POA: Diagnosis not present

## 2018-07-10 DIAGNOSIS — M7062 Trochanteric bursitis, left hip: Secondary | ICD-10-CM | POA: Diagnosis not present

## 2018-08-07 DIAGNOSIS — G894 Chronic pain syndrome: Secondary | ICD-10-CM | POA: Diagnosis not present

## 2018-08-07 DIAGNOSIS — M5030 Other cervical disc degeneration, unspecified cervical region: Secondary | ICD-10-CM | POA: Diagnosis not present

## 2018-08-07 DIAGNOSIS — M47812 Spondylosis without myelopathy or radiculopathy, cervical region: Secondary | ICD-10-CM | POA: Diagnosis not present

## 2018-08-13 DIAGNOSIS — M47817 Spondylosis without myelopathy or radiculopathy, lumbosacral region: Secondary | ICD-10-CM | POA: Diagnosis not present

## 2018-08-19 ENCOUNTER — Emergency Department (HOSPITAL_COMMUNITY): Payer: 59

## 2018-08-19 ENCOUNTER — Ambulatory Visit
Admission: EM | Admit: 2018-08-19 | Discharge: 2018-08-19 | Disposition: A | Discharge status: Home / Self Care | Payer: 59 | Source: Home / Self Care

## 2018-08-19 ENCOUNTER — Emergency Department (HOSPITAL_COMMUNITY)
Admission: EM | Admit: 2018-08-19 | Discharge: 2018-08-19 | Disposition: A | Discharge status: Home / Self Care | Payer: 59 | Attending: Emergency Medicine | Admitting: Emergency Medicine

## 2018-08-19 ENCOUNTER — Encounter (HOSPITAL_COMMUNITY): Payer: Self-pay | Admitting: Emergency Medicine

## 2018-08-19 ENCOUNTER — Other Ambulatory Visit: Payer: Self-pay

## 2018-08-19 DIAGNOSIS — I10 Essential (primary) hypertension: Secondary | ICD-10-CM | POA: Diagnosis not present

## 2018-08-19 DIAGNOSIS — I959 Hypotension, unspecified: Secondary | ICD-10-CM | POA: Diagnosis not present

## 2018-08-19 DIAGNOSIS — R42 Dizziness and giddiness: Secondary | ICD-10-CM

## 2018-08-19 DIAGNOSIS — R001 Bradycardia, unspecified: Secondary | ICD-10-CM | POA: Diagnosis not present

## 2018-08-19 DIAGNOSIS — R4781 Slurred speech: Secondary | ICD-10-CM

## 2018-08-19 DIAGNOSIS — E860 Dehydration: Secondary | ICD-10-CM | POA: Diagnosis not present

## 2018-08-19 DIAGNOSIS — Z79899 Other long term (current) drug therapy: Secondary | ICD-10-CM | POA: Diagnosis not present

## 2018-08-19 DIAGNOSIS — R110 Nausea: Secondary | ICD-10-CM | POA: Diagnosis not present

## 2018-08-19 DIAGNOSIS — R079 Chest pain, unspecified: Secondary | ICD-10-CM | POA: Diagnosis not present

## 2018-08-19 HISTORY — DX: Essential (primary) hypertension: I10

## 2018-08-19 HISTORY — DX: Other injury of unspecified body region, initial encounter: T14.8XXA

## 2018-08-19 HISTORY — DX: Depression, unspecified: F32.A

## 2018-08-19 HISTORY — DX: Major depressive disorder, single episode, unspecified: F32.9

## 2018-08-19 LAB — CBC
HEMATOCRIT: 40.2 % (ref 36.0–46.0)
Hemoglobin: 12.3 g/dL (ref 12.0–15.0)
MCH: 28.2 pg (ref 26.0–34.0)
MCHC: 30.6 g/dL (ref 30.0–36.0)
MCV: 92.2 fL (ref 80.0–100.0)
Platelets: 222 10*3/uL (ref 150–400)
RBC: 4.36 MIL/uL (ref 3.87–5.11)
RDW: 13.2 % (ref 11.5–15.5)
WBC: 9 10*3/uL (ref 4.0–10.5)
nRBC: 0 % (ref 0.0–0.2)

## 2018-08-19 LAB — RAPID URINE DRUG SCREEN, HOSP PERFORMED
Amphetamines: NOT DETECTED
Barbiturates: NOT DETECTED
Benzodiazepines: NOT DETECTED
Cocaine: NOT DETECTED
Opiates: NOT DETECTED
Tetrahydrocannabinol: NOT DETECTED

## 2018-08-19 LAB — ETHANOL: Alcohol, Ethyl (B): <10 mg/dL (ref <10)

## 2018-08-19 LAB — COMPREHENSIVE METABOLIC PANEL
ALK PHOS: 93 U/L (ref 38–126)
ALT: 26 U/L (ref 0–44)
ANION GAP: 11 (ref 5–15)
AST: 26 U/L (ref 15–41)
Albumin: 4 g/dL (ref 3.5–5.0)
BUN: 24 mg/dL — ABNORMAL HIGH (ref 8–23)
CALCIUM: 9.1 mg/dL (ref 8.9–10.3)
CO2: 25 mmol/L (ref 22–32)
Chloride: 102 mmol/L (ref 98–111)
Creatinine, Ser: 1.49 mg/dL — ABNORMAL HIGH (ref 0.44–1.00)
GFR calc Af Amer: 43 mL/min — ABNORMAL LOW (ref >60)
GFR calc non Af Amer: 37 mL/min — ABNORMAL LOW (ref >60)
Glucose, Bld: 95 mg/dL (ref 70–99)
Potassium: 4.7 mmol/L (ref 3.5–5.1)
Sodium: 138 mmol/L (ref 135–145)
Total Bilirubin: 0.6 mg/dL (ref 0.3–1.2)
Total Protein: 7.7 g/dL (ref 6.5–8.1)

## 2018-08-19 LAB — URINALYSIS, ROUTINE W REFLEX MICROSCOPIC
Bilirubin Urine: NEGATIVE
Glucose, UA: NEGATIVE mg/dL
KETONES UR: NEGATIVE mg/dL
Nitrite: NEGATIVE
Protein, ur: NEGATIVE mg/dL
Specific Gravity, Urine: 1.005 (ref 1.005–1.030)
pH: 5 (ref 5.0–8.0)

## 2018-08-19 LAB — PROTIME-INR
INR: 1 (ref 0.8–1.2)
Prothrombin Time: 13.4 seconds (ref 11.4–15.2)

## 2018-08-19 LAB — DIFFERENTIAL
Abs Immature Granulocytes: 0.03 10*3/uL (ref 0.00–0.07)
Basophils Absolute: 0 10*3/uL (ref 0.0–0.1)
Basophils Relative: 0 %
Eosinophils Absolute: 0.1 10*3/uL (ref 0.0–0.5)
Eosinophils Relative: 1 %
Immature Granulocytes: 0 %
LYMPHS ABS: 2.5 10*3/uL (ref 0.7–4.0)
Lymphocytes Relative: 27 %
Monocytes Absolute: 0.7 10*3/uL (ref 0.1–1.0)
Monocytes Relative: 8 %
Neutro Abs: 5.7 10*3/uL (ref 1.7–7.7)
Neutrophils Relative %: 64 %

## 2018-08-19 LAB — I-STAT TROPONIN, ED: Troponin i, poc: 0 ng/mL (ref 0.00–0.08)

## 2018-08-19 LAB — TROPONIN I: Troponin I: <0.03 ng/mL (ref <0.03)

## 2018-08-19 LAB — APTT: aPTT: 31 seconds (ref 24–36)

## 2018-08-19 MED ORDER — LORAZEPAM 2 MG/ML IJ SOLN
1.0000 mg | Freq: Once | INTRAMUSCULAR | Status: AC
  Administered 2018-08-19: 1 mg via INTRAVENOUS
  Filled 2018-08-19: qty 1

## 2018-08-19 MED ORDER — IOPAMIDOL (ISOVUE-370) INJECTION 76%
INTRAVENOUS | Status: AC
  Administered 2018-08-19: 21:00:00
  Filled 2018-08-19: qty 100

## 2018-08-19 MED ORDER — SODIUM CHLORIDE 0.9 % IV BOLUS
1000.0000 mL | Freq: Once | INTRAVENOUS | Status: AC
  Administered 2018-08-19: 1000 mL via INTRAVENOUS

## 2018-08-19 NOTE — ED Notes (Signed)
X-ray at bedside

## 2018-08-19 NOTE — ED Provider Notes (Signed)
[facility] EMERGENCY DEPARTMENT Provider Note   CSN: [id number] Arrival date & time: 08/19/18  1413    History   Chief Complaint Chief Complaint  Patient presents with  . Dizziness    HPI Shelley Lloyd is a 63 y.o. female presenting today for sudden onset dizziness, slurred speech and chest pressure.  Patient reports that she was watching TV this morning and closed her eyes "for a few seconds" and when she opened them and stood up at 10 AM she had sudden onset dizziness that she describes as the room spinning.  Patient also noticed that her speech became slurred at that time and was having difficulty speaking.  Patient states that the symptoms have been gradually improving since time of onset however she still feels that her speech is slurred at time of my evaluation.  Additionally patient endorses chest pressure that also began at 10 AM without shortness of breath.  She describes her chest pressure as a mild sensation that was constant for 2-3 hours before resolving on its own.  Patient presented to urgent care today before being sent to our facility.  Pertinent history patient with chronic pain taking oxycodone daily.  She denies overdose today.     HPI  Past Medical History:  Diagnosis Date  . Depression   . Hypertension   . Nerve compression     There are no active problems to display for this patient.   History reviewed. No pertinent surgical history.   OB History   No obstetric history on file.      Home Medications    Prior to Admission medications   Medication Sig Start Date End Date Taking? Authorizing Provider  ARIPiprazole (ABILIFY) 15 MG tablet Take 15 mg by mouth at bedtime. 05/29/18  Yes [provider]  citalopram (CELEXA) 40 MG tablet Take 40 mg by mouth daily. 06/24/18  Yes [provider]  lisinopril (PRINIVIL,ZESTRIL) 10 MG tablet Take 10 mg by mouth daily.   Yes [provider]  mirtazapine (REMERON)  15 MG tablet Take 15 mg by mouth at bedtime. 08/07/18  Yes [provider]  oxybutynin (DITROPAN-XL) 10 MG 24 hr tablet Take 10 mg by mouth daily. 06/24/18  Yes [provider]  oxycodone (OXY-IR) 5 MG capsule Take 10 mg by mouth every 4 (four) hours as needed for pain.    Yes [provider]  SYNTHROID 88 MCG tablet Take 88 mcg by mouth daily. 06/30/18  Yes [provider]  tiZANidine (ZANAFLEX) 4 MG tablet Take 4 mg by mouth 2 (two) times daily as needed. 04/08/18  Yes [provider]  XTAMPZA ER 13.5 MG C12A Take 13.5 mg by mouth every 12 (twelve) hours. 08/07/18  Yes [provider]    Family History Family History  Problem Relation Age of Onset  . Breast cancer Paternal Aunt   . Breast cancer Sister   . Breast cancer Cousin        2 first cousins    Social History Social History   Tobacco Use  . Smoking status: Never Smoker  . Smokeless tobacco: Never Used  Substance Use Topics  . Alcohol use: Not Currently  . Drug use: Not on file     Allergies   Lyrica [pregabalin]   Review of Systems Review of Systems  Constitutional: Negative.  Negative for chills and fever.  Eyes: Negative.  Negative for visual disturbance.  Respiratory: Negative.  Negative for shortness of breath.  Cardiovascular: Positive for chest pain.  Gastrointestinal: Negative.  Negative for abdominal pain, nausea and vomiting.  Neurological: Positive for dizziness and speech difficulty. Negative for weakness, numbness and headaches.  All other systems reviewed and are negative.  Physical Exam Updated Vital Signs BP (!) 158/71   Pulse 78   Temp (!) 97.5 F (36.4 C)   Resp 18   Ht 5\' 6"  (1.676 m)   Wt 90.7 kg   SpO2 99%   BMI 32.28 kg/m   Physical Exam Constitutional:      General: She is not in acute distress.    Appearance: Normal appearance. She is well-developed. She is not ill-appearing or diaphoretic.  HENT:     Head: Normocephalic  and atraumatic.     Right Ear: Tympanic membrane, ear canal and external ear normal.     Left Ear: Tympanic membrane, ear canal and external ear normal.     Nose: Nose normal.     Mouth/Throat:     Mouth: Mucous membranes are moist.     Pharynx: Oropharynx is clear.  Eyes:     General: Vision grossly intact. Gaze aligned appropriately.     Extraocular Movements: Extraocular movements intact.     Conjunctiva/sclera: Conjunctivae normal.     Pupils: Pupils are equal, round, and reactive to light.  Neck:     Musculoskeletal: Full passive range of motion without pain, normal range of motion and neck supple.     Trachea: Trachea and phonation normal. No tracheal deviation.  Cardiovascular:     Rate and Rhythm: Normal rate and regular rhythm.     Pulses: Normal pulses.          Radial pulses are 2+ on the right side and 2+ on the left side.       Dorsalis pedis pulses are 2+ on the right side and 2+ on the left side.       Posterior tibial pulses are 2+ on the right side and 2+ on the left side.     Heart sounds: Normal heart sounds.  Pulmonary:     Effort: Pulmonary effort is normal. No respiratory distress.     Breath sounds: Normal breath sounds. No rhonchi.  Chest:     Chest wall: No deformity or crepitus.  Abdominal:     General: There is no distension.     Palpations: Abdomen is soft.     Tenderness: There is no abdominal tenderness. There is no guarding or rebound.  Musculoskeletal: Normal range of motion.        General: No tenderness.     Right lower leg: Normal. She exhibits no tenderness. No edema.     Left lower leg: Normal. She exhibits no tenderness. No edema.  Feet:     Right foot:     Protective Sensation: 3 sites tested. 3 sites sensed.     Left foot:     Protective Sensation: 3 sites tested. 3 sites sensed.  Skin:    General: Skin is warm and dry.  Neurological:     Mental Status: She is alert.     GCS: GCS eye subscore is 4. GCS verbal subscore is 5. GCS motor  subscore is 6.     Comments: Mental Status: Alert, oriented, thought content appropriate, able to give a coherent history. Speech fluent without evidence of aphasia. Able to follow 2 step commands without difficulty. Cranial Nerves: II: Peripheral visual fields grossly normal, pupils equal, round, reactive to light III,IV, VI: ptosis  not present, extra-ocular motions intact bilaterally V,VII: Possible left corner of mouth drooping, eyebrows raise symmetric, facial light touch sensation equal VIII: hearing grossly normal to voice X: uvula elevates symmetrically XI: bilateral shoulder shrug symmetric and strong XII: midline tongue extension without fassiculations Motor: Normal tone. 5/5 strength in upper and lower extremities bilaterally including strong and equal grip strength and dorsiflexion/plantar flexion Sensory: Sensation intact to light touch in all extremities.Negative Romberg.  Deep Tendon Reflexes: 2+ and symmetric in the biceps and patella Cerebellar: normal finger-to-nose with bilateral upper extremities. Normal heel-to -shin balance bilaterally of the lower extremity. No pronator drift.  Gait: normal gait and balance CV: distal pulses palpable throughout  Psychiatric:        Behavior: Behavior normal.      ED Treatments / Results  Labs (all labs ordered are listed, but only abnormal results are displayed) Labs Reviewed  COMPREHENSIVE METABOLIC PANEL - Abnormal; Notable for the following components:      Result Value   BUN 24 (*)    Creatinine, Ser 1.49 (*)    GFR calc non Af Amer 37 (*)    GFR calc Af Amer 43 (*)    All other components within normal limits  URINALYSIS, ROUTINE W REFLEX MICROSCOPIC - Abnormal; Notable for the following components:   Hgb urine dipstick SMALL (*)    Leukocytes,Ua LARGE (*)    Bacteria, UA RARE (*)    All other components within normal limits  URINE CULTURE  ETHANOL  PROTIME-INR  APTT  CBC  DIFFERENTIAL  RAPID URINE  DRUG SCREEN, HOSP PERFORMED  TROPONIN I  I-STAT TROPONIN, ED    EKG EKG Interpretation  Date/Time:  Tuesday August 19 2018 14:23:07 EST Ventricular Rate:  63 PR Interval:    QRS Duration: 96 QT Interval:  410 QTC Calculation: 420 R Axis:   46 Text Interpretation:  Sinus rhythm Confirmed by Vanetta Mulders [phone]) on 08/19/2018 2:28:48 PM   Radiology Ct Head Wo Contrast  Result Date: 08/19/2018 CLINICAL DATA:  Dizzy spells and slurred speech. EXAM: CT HEAD WITHOUT CONTRAST TECHNIQUE: Contiguous axial images were obtained from the base of the skull through the vertex without intravenous contrast. COMPARISON:  None. FINDINGS: Brain: The ventricles are normal in size and configuration. No extra-axial fluid collections are identified. The gray-white differentiation is maintained. No CT findings for acute hemispheric infarction or intracranial hemorrhage. No mass lesions. The brainstem and cerebellum are normal. Vascular: No hyperdense vessels or obvious aneurysm. Skull: No acute skull fracture.  No bone lesion. Sinuses/Orbits: The paranasal sinuses and mastoid air cells are clear. The globes are intact. Other: No scalp lesions, laceration or hematoma. IMPRESSION: Normal head CT. Electronically Signed   By: Rudie Meyer M.D.   On: 08/19/2018 15:51   Mr Brain Wo Contrast  Result Date: 08/19/2018 CLINICAL DATA:  Hypotension and dizziness EXAM: MRI HEAD WITHOUT CONTRAST TECHNIQUE: Multiplanar, multiecho pulse sequences of the brain and surrounding structures were obtained without intravenous contrast. COMPARISON:  Head CT 08/19/2018 FINDINGS: BRAIN: There is no acute infarct, acute hemorrhage, hydrocephalus or extra-axial collection. The midline structures are normal. No midline shift or other mass effect. The white matter signal is normal for the patient's age. The cerebral and cerebellar volume are age-appropriate. Susceptibility-sensitive sequences show no chronic microhemorrhage or superficial  siderosis. VASCULAR: Major intracranial arterial and venous sinus flow voids are normal. SKULL AND UPPER CERVICAL SPINE: Calvarial bone marrow signal is normal. There is no skull base mass. Visualized upper cervical spine and  soft tissues are normal. SINUSES/ORBITS: No fluid levels or advanced mucosal thickening. No mastoid or middle ear effusion. The orbits are normal. IMPRESSION: Normal brain. Electronically Signed   By: Deatra Robinson M.D.   On: 08/19/2018 17:46   Dg Chest Portable 1 View  Result Date: 08/19/2018 CLINICAL DATA:  Dizziness with slurred speech.  Hypertension. EXAM: PORTABLE CHEST 1 VIEW COMPARISON:  April 03, 2011 FINDINGS: The lungs are clear. The heart size and pulmonary vascularity are normal. No adenopathy. There is evidence of old trauma involving the lateral left clavicle, stable. There is postoperative change in lower cervical spine. IMPRESSION: No edema or consolidation.  Stable cardiac silhouette. Electronically Signed   By: Bretta Bang III M.D.   On: 08/19/2018 15:13   Ct Angio Chest/abd/pel For Dissection W And/or Wo Contrast  Result Date: 08/19/2018 CLINICAL DATA:  63 year old who had a syncopal episode associated with chest and back pain. Current history of hypertension. Evaluate for possible dissection. EXAM: CT ANGIOGRAPHY CHEST, ABDOMEN AND PELVIS TECHNIQUE: Initially, multidetector CT imaging through the chest was performed prior to IV contrast administration. Multidetector CT imaging through the chest, abdomen and pelvis was performed using the standard protocol during bolus administration of intravenous contrast. Multiplanar reconstructed images and MIPs were obtained and reviewed to evaluate the vascular anatomy. CONTRAST:  80 mL ISOVUE-370 IOPAMIDOL INJECTION 76% IV. COMPARISON:  None. FINDINGS: CTA CHEST FINDINGS Cardiovascular: Unenhanced images demonstrate no evidence of mural hematoma. No evidence of calcified plaque involving the thoracic aorta. Excellent  opacification of the systemic arterial system on the post contrast images. No evidence of thoracic aortic aneurysm or dissection. No visible atherosclerosis. Proximal great vessels widely patent. Normal heart size. No visible coronary atherosclerosis. No pericardial effusion. Mediastinum/Nodes: No pathologically enlarged mediastinal, hilar or axillary lymph nodes. No mediastinal masses. Normal-appearing esophagus. Normal-appearing thyroid gland. Lungs/Pleura: Small bleb at the LEFT lung apex. Pulmonary parenchyma clear without localized airspace consolidation, interstitial disease, or parenchymal nodules or masses. Central airways patent without significant bronchial wall thickening. No pleural effusions. No pleural plaques or masses. 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 DISH involving the LOWER thoracic spine. No acute findings. Review of the MIP images confirms the above findings. CTA ABDOMEN AND PELVIS FINDINGS VASCULAR Aorta: Mild atherosclerosis. No evidence of dissection or aneurysm. Celiac: Widely patent. SMA: Widely patent. Renals: Single RIGHT renal artery and 2 LEFT renal arteries which are widely patent. IMA: Widely patent. Inflow: BILATERAL iliofemoral arteries widely patent with minimal atherosclerosis. Veins: Not evaluated. Review of the MIP images confirms the above findings. NON-VASCULAR Hepatobiliary: Liver normal in size and appearance. Gallbladder normal in appearance without calcified gallstones. No biliary ductal dilation. Pancreas: Normal in appearance without evidence of mass, ductal dilation, or inflammation. Spleen: Normal in size and appearance. Adrenals/Urinary Tract: Normal appearing adrenal glands. Kidneys normal in size and appearance without focal parenchymal abnormality. No hydronephrosis. No evidence of urinary tract calculi. Normal appearing urinary bladder. Stomach/Bowel: Normal appearing stomach filled with food and fluid.  Normal-appearing small bowel. Moderate stool burden throughout normal appearing colon. Normal appendix in the RIGHT UPPER pelvis. Lymphatic: No pathologic lymphadenopathy. Reproductive: Surgically absent uterus. No adnexal masses. Other: None. Musculoskeletal: Severe degenerative disc disease and spondylosis at L2-3 and L5-S1. No acute findings. Review of the MIP images confirms the above findings. IMPRESSION: 1. No evidence of thoracic or abdominal aortic aneurysm or dissection. 2. No acute cardiopulmonary disease. 3. No acute abnormalities involving the abdomen or pelvis. 4.  Aortic Atherosclerosis, minimal.  (ICD10-170.0)  Electronically Signed   By: Hulan Saashomas  Lawrence M.D.   On: 08/19/2018 21:03    Procedures Procedures (including critical care time)  Medications Ordered in ED Medications  LORazepam (ATIVAN) injection 1 mg (1 mg Intravenous Given 08/19/18 1648)  sodium chloride 0.9 % bolus 1,000 mL (1,000 mLs Intravenous New Bag/Given 08/19/18 2012)  iopamidol (ISOVUE-370) 76 % injection (  Contrast Given 08/19/18 2032)     Initial Impression / Assessment and Plan / ED Course  I have reviewed the triage vital signs and the nursing notes.  Pertinent labs & imaging results that were available during my care of the patient were reviewed by me and considered in my medical decision making (see chart for details).  Clinical Course as of Aug 19 2133  Tue Aug 19, 2018  1545 Discussed with on-call Neuro; advises MRI Braine w/o contrast and reconsult them with results.   [BM]  1752 Ambulatory Neuro Referral - Dr. Jerrell BelfastAurora   [BM]    Clinical Course User Index [BM] Bill SalinasMorelli,  A, PA-C   63 year old female presenting today for sudden onset dizziness and slurred speech as well as chest pressure at 10 AM.  At initial evaluation time since onset greater than 4.5 hours however not by much.  Per patient symptoms appear to be resolving.  Questionable left sided facial droop on my examination and patient  still endorsing feeling of slurred speech.  Otherwise neurologic examination is without abnormal findings.  Stroke work-up ordered however code stroke not called because patient is out of the window.  Additionally troponin and chest x-ray ordered due to complaint of chest pressure which has also resolved.  Consult ordered to neurology.  Case discussed with Dr. Deretha EmoryZackowski who agrees with work-up. - Initial troponin negative UDS negative Urinalysis suggestive of UTI, sent for culture CMP with creatinine of 1.49 Ethanol negative APTT negative PT/INR within normal limits CBC within normal limits EKG without acute findings reviewed by Dr. Deretha EmoryZackowski Chest x-ray negative CT head negative  Discussed case with MRI who advised MRI brain without contrast - Patient reassessed resting comfortably no acute distress with her husband at bedside.  She is eating chips and drinking a soda without assistance or difficulty.  She states that she feels her speech is improved.  No obvious droop on reexamination.  Repeat neuro examination without abnormality. - MRI brain negative - Received call from on-call neurology, Dr. Jerrell BelfastAurora who advises that patient may follow-up with outpatient neurology clinic regarding visit today. He advised giving patient ambulatory referral to neurology.  Dr. Jerrell BelfastAurora does not feel that patient needs admission for TIA work-up. - Patient reassessed, resting comfortably and in no acute distress.  I have ambulated patient around emergency department hallway, steady gait without assistance or difficulty. - Case discussed with Dr. Adela LankFloyd who is attending after shift change. Elevated creatinine noted.  Fluid bolus ordered.  CT dissection study ordered.  Patient informed of care plan and is agreeable, she is eating a cheeseburger. - CT dissection study: IMPRESSION:  1. No evidence of thoracic or abdominal aortic aneurysm or  dissection.  2. No acute cardiopulmonary disease.  3. No acute  abnormalities involving the abdomen or pelvis.  4. Aortic Atherosclerosis, minimal. (ICD10-170.0)   Patient informed of incidental findings and to follow-up with PCP. - Patient advised of work-up today and plan of care including outpatient neurology follow-up regarding her symptoms today.  Referral given.  Ambulatory referral ordered.  Patient encouraged to increase her water intake to avoid dehydration noted on lab  work today and she states understanding.  At final reassessment patient is very well-appearing in no acute distress sitting with her husband.  She has been eating and drinking throughout emergency department visit.  There is no recurrence of chest pain.  I doubt ACS, dissection, PE or any other acute cardiopulmonary etiology of her symptoms today, suspect anxiety secondary to her dizziness as cause of resolved chest pressure as she has had 2 negative troponins and no acute EKG changes.  Case rediscussed with Dr. Adela Lank who agrees that patient may now be discharged with her neurology referral at this time.  Urine culture was added on for possible UTI however patient is asymptomatic, we will await culture she has been informed that she will be contacted by [facility] health for results necessitating antibiotic treatment. Patient states understanding and is agreeable. - At this time there does not appear to be any evidence of an acute emergency medical condition and the patient appears stable for discharge with appropriate outpatient follow up. Diagnosis was discussed with patient who verbalizes understanding of care plan and is agreeable to discharge. I have discussed return precautions with patient and husband who verbalize understanding of return precautions. Patient strongly encouraged to follow-up with their PCP and Neurology. All questions answered. Patient discharged in good condition.  Patient's case discussed with Dr. Adela Lank who agrees with plan to discharge with follow-up.   Note: Portions of  this report may have been transcribed using voice recognition software. Every effort was made to ensure accuracy; however, inadvertent computerized transcription errors may still be present. Final Clinical Impressions(s) / ED Diagnoses   Final diagnoses:  Dizziness  Slurred speech  Dehydration    ED Discharge Orders         Ordered    Ambulatory referral to Neurology    Comments:  An appointment is requested in approximately: 1 week   08/19/18 2100           Elizabeth Palau 08/19/18 2143    Melene Plan, DO 08/19/18 2238

## 2018-08-19 NOTE — ED Provider Notes (Signed)
[facility]    CSN: [id number] Arrival date & time: 08/19/18  1116     History   Chief Complaint Chief Complaint  Patient presents with  . Dizziness    HPI Shelley Lloyd is a 63 y.o. female.   63 year old female comes in for dizziness and chest pressure. Patient states she was at a nursing home when she nodded off for a few seconds, and woke up feeling drunk, dizzy. At that time she had a blood pressure of 84/64. She had some tea, which made her feel better with CBG of 139. She has continued to be symptomatic with dizziness, chest pressure. She has felt like she is struggling with her words. Denies facial drooping, one sided weakness, ataxia. She denies shortness of breath. She has a history of HTN, on lisinopril without recent changes. She is on oxycodone for neck pain, with same doses she has been on for many years. She has not had any new medications. Denies personal history of heart disease, diabetes.      Past Medical History:  Diagnosis Date  . Depression   . Hypertension   . Nerve compression     There are no active problems to display for this patient.   History reviewed. No pertinent surgical history.  OB History   No obstetric history on file.      Home Medications    Prior to Admission medications   Medication Sig Start Date End Date Taking? Authorizing Provider  lisinopril (PRINIVIL,ZESTRIL) 10 MG tablet Take 10 mg by mouth daily.   Yes [provider]  oxycodone (OXY-IR) 5 MG capsule Take 5 mg by mouth every 4 (four) hours as needed.   Yes [provider]    Family History Family History  Problem Relation Age of Onset  . Breast cancer Paternal Aunt   . Breast cancer Sister   . Breast cancer Cousin        2 first cousins    Social History Social History   Tobacco Use  . Smoking status: Never Smoker  . Smokeless tobacco: Never Used  Substance Use Topics  . Alcohol use: Not Currently  . Drug use: Not on file      Allergies   Lyrica [pregabalin]   Review of Systems Review of Systems  Reason unable to perform ROS: See HPI as above.     Physical Exam Triage Vital Signs ED Triage Vitals  Enc Vitals Group     BP 08/19/18 1233 117/77     Pulse Rate 08/19/18 1233 (!) 56     Resp --      Temp 08/19/18 1233 98 F (36.7 C)     Temp Source 08/19/18 1233 Oral     SpO2 08/19/18 1233 98 %     Weight --      Height --      Head Circumference --      Peak Flow --      Pain Score 08/19/18 1234 0     Pain Loc --      Pain Edu? --      Excl. in GC? --    No data found.  Updated Vital Signs BP 128/83   Pulse 63   Temp 98 F (36.7 C) (Oral)   Resp 18   SpO2 98%   Physical Exam Constitutional:      General: She is not in acute distress.    Appearance: Normal appearance. She is not ill-appearing, toxic-appearing or diaphoretic.  HENT:     Head: Normocephalic and atraumatic.     Mouth/Throat:     Mouth: Mucous membranes are moist.     Pharynx: Oropharynx is clear. Uvula midline.  Eyes:     Extraocular Movements: Extraocular movements intact.     Conjunctiva/sclera: Conjunctivae normal.     Pupils: Pupils are equal, round, and reactive to light.  Neck:     Musculoskeletal: Normal range of motion and neck supple.  Cardiovascular:     Rate and Rhythm: Regular rhythm. Bradycardia present.     Heart sounds: No murmur. No friction rub. No gallop.   Pulmonary:     Effort: Pulmonary effort is normal. No respiratory distress.     Breath sounds: Normal breath sounds. No stridor. No wheezing, rhonchi or rales.  Neurological:     Mental Status: She is alert.     GCS: GCS eye subscore is 4. GCS verbal subscore is 5. GCS motor subscore is 6.     Sensory: Sensation is intact.     Motor: Motor function is intact.     Coordination: Coordination is intact.     Gait: Gait is intact.     Comments: Slight asymmetry to her mouth, states it is normal for her. Otherwise CN grossly intact.        UC Treatments / Results  Labs (all labs ordered are listed, but only abnormal results are displayed) Labs Reviewed - No data to display  EKG None  Radiology No results found.  Procedures Procedures (including critical care time)  Medications Ordered in UC Medications - No data to display  Initial Impression / Assessment and Plan / UC Course  I have reviewed the triage vital signs and the nursing notes.  Pertinent labs & imaging results that were available during my care of the patient were reviewed by me and considered in my medical decision making (see chart for details).    Discussed case with Dr Tracie Harrier. 63 year old female with history of HTN, chronic neck pain on narcotics comes in with sudden onset of feeling drunk, dizzy, chest pressure. She was hypotensive at 84/64 when checked. States has had mild improvement after drinking some tea. However, continues to feel "cloudy" and hard to voice words. Her neurology exam is grossly intact with baseline asymmetry of her mouth. Her EKG is sinus bradycardia, 55bpm, no acute ST changes. She is afebrile, without hypotension, tachypnea. Given history and exam, will need further evaluation at the emergency department. Given possible new onset symptomatic bradycardia, called EMS for transport.  Case discussed with Dr Tracie Harrier, who agrees to plan.  Final Clinical Impressions(s) / UC Diagnoses   Final diagnoses:  Bradycardia  Dizziness   Discharge Instructions   None    ED Prescriptions    None        Belinda Fisher, PA-C 08/19/18 1328

## 2018-08-19 NOTE — ED Notes (Signed)
Patient transported to CT 

## 2018-08-19 NOTE — ED Notes (Signed)
Patient transported to MRI 

## 2018-08-19 NOTE — Discharge Instructions (Addendum)
You have been diagnosed today with Dizziness and slurred speech  At this time there does not appear to be the presence of an emergent medical condition, however there is always the potential for conditions to change. Please read and follow the below instructions.  Please return to the Emergency Department immediately for any new or worsening symptoms return. Please be sure to follow up with your Primary Care Provider within one week regarding your visit today; please call their office to schedule an appointment even if you are feeling better for a follow-up visit. Please follow-up with the neurologist at [facility].  You have an appointment with them next week for further evaluation of your visit today. Your CT scan today showed degenerative disc disease and a bleb in your left upper lung.  Please discuss these with your primary care provider at your next visit. Your lab work showed signs of dehydration today.  Please drink plenty of water to avoid dehydration and get plenty of rest. Your urine showed signs of possible urinary tract infection, we have sent your urine to be cultured to see if it grows bacteria.  If it grows bacteria necessitating antibiotic treatment you will be contacted by [facility] health with a prescription.  Get help right away if: You throw up (vomit) or have watery poop (diarrhea), and you cannot eat or drink anything. You have trouble: Talking. Walking. Swallowing. Using your arms, hands, or legs. You feel generally weak. You are not thinking clearly, or you have trouble forming sentences. A friend or family member may notice this. You have: Chest pain. Pain in your belly (abdomen). Shortness of breath. Sweating. Your vision changes. You are bleeding. You have a very bad headache. You have neck pain or a stiff neck. You have a fever. Any other new or concerning symptoms  Please read the additional information packets attached to your discharge  summary.  Do not take your medicine if  develop an itchy rash, swelling in your mouth or lips, or difficulty breathing.

## 2018-08-19 NOTE — ED Triage Notes (Addendum)
Pt states about 10 am had a " drunk " episode, got dizzy  Everything went blank  And her speech was slurred, wwent to [facility] and was seen there pt had some bradycardia 56 and her bp was low 84/60,  It seems that all has cleared except her speech is still slurred , has iv left ac cbg 139 per ems, VAN is neg. Pt states that she had an episode of this last oct but did ot see anyone

## 2018-08-19 NOTE — ED Triage Notes (Signed)
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 States had dry mouth, drank some tea and felt better. CBG 139. Pt now c/o feeling like she is struggling with her words. No facial drooping noted.

## 2018-08-21 LAB — URINE CULTURE

## 2018-08-26 ENCOUNTER — Encounter: Payer: Self-pay | Admitting: Neurology

## 2018-08-26 ENCOUNTER — Ambulatory Visit: Payer: 59 | Admitting: Neurology

## 2018-08-26 ENCOUNTER — Other Ambulatory Visit: Payer: Self-pay

## 2018-08-26 VITALS — BP 105/60 | HR 62 | Resp 18 | Ht 66.0 in | Wt 195.0 lb

## 2018-08-26 DIAGNOSIS — I951 Orthostatic hypotension: Secondary | ICD-10-CM | POA: Insufficient documentation

## 2018-08-26 DIAGNOSIS — H8110 Benign paroxysmal vertigo, unspecified ear: Secondary | ICD-10-CM | POA: Diagnosis not present

## 2018-08-26 DIAGNOSIS — H814 Vertigo of central origin: Secondary | ICD-10-CM | POA: Diagnosis not present

## 2018-08-26 DIAGNOSIS — I10 Essential (primary) hypertension: Secondary | ICD-10-CM | POA: Diagnosis not present

## 2018-08-26 DIAGNOSIS — E039 Hypothyroidism, unspecified: Secondary | ICD-10-CM | POA: Diagnosis not present

## 2018-08-26 NOTE — Patient Instructions (Signed)
Dehydration, Adult  Dehydration is when there is not enough fluid or water in your body. This happens when you lose more fluids than you take in. Dehydration can range from mild to very bad. It should be treated right away to keep it from getting very bad. Symptoms of mild dehydration may include:  Thirst.  Dry lips.  Slightly dry mouth.  Dry, warm skin.  Dizziness. Symptoms of moderate dehydration may include:  Very dry mouth.  Muscle cramps.  Dark pee (urine). Pee may be the color of tea.  Your body making less pee.  Your eyes making fewer tears.  Heartbeat that is uneven or faster than normal (palpitations).  Headache.  Light-headedness, especially when you stand up from sitting.  Fainting (syncope). Symptoms of very bad dehydration may include:  Changes in skin, such as: ? Cold and clammy skin. ? Blotchy (mottled) or pale skin. ? Skin that does not quickly return to normal after being lightly pinched and let go (poor skin turgor).  Changes in body fluids, such as: ? Feeling very thirsty. ? Your eyes making fewer tears. ? Not sweating when body temperature is high, such as in hot weather. ? Your body making very little pee.  Changes in vital signs, such as: ? Weak pulse. ? Pulse that is more than 100 beats a minute when you are sitting still. ? Fast breathing. ? Low blood pressure.  Other changes, such as: ? Sunken eyes. ? Cold hands and feet. ? Confusion. ? Lack of energy (lethargy). ? Trouble waking up from sleep. ? Short-term weight loss. ? Unconsciousness. Follow these instructions at home:   If told by your doctor, drink an ORS: ? Make an ORS by using instructions on the package. ? Start by drinking small amounts, about  cup (120 mL) every 5-10 minutes. ? Slowly drink more until you have had the amount that your doctor said to have.  Drink enough clear fluid to keep your pee clear or pale yellow. If you were told to drink an ORS, finish the  ORS first, then start slowly drinking clear fluids. Drink fluids such as: ? Water. Do not drink only water by itself. Doing that can make the salt (sodium) level in your body get too low (hyponatremia). ? Ice chips. ? Fruit juice that you have added water to (diluted). ? Low-calorie sports drinks.  Avoid: ? Alcohol. ? Drinks that have a lot of sugar. These include high-calorie sports drinks, fruit juice that does not have water added, and soda. ? Caffeine. ? Foods that are greasy or have a lot of fat or sugar.  Take over-the-counter and prescription medicines only as told by your doctor.  Do not take salt tablets. Doing that can make the salt level in your body get too high (hypernatremia).  Eat foods that have minerals (electrolytes). Examples include bananas, oranges, potatoes, tomatoes, and spinach.  Keep all follow-up visits as told by your doctor. This is important. Contact a doctor if:  You have belly (abdominal) pain that: ? Gets worse. ? Stays in one area (localizes).  You have a rash.  You have a stiff neck.  You get angry or annoyed more easily than normal (irritability).  You are more sleepy than normal.  You have a harder time waking up than normal.  You feel: ? Weak. ? Dizzy. ? Very thirsty.  You have peed (urinated) only a small amount of very dark pee during 6-8 hours. Get help right away if:  You have   symptoms of very bad dehydration.  You cannot drink fluids without throwing up (vomiting).  Your symptoms get worse with treatment.  You have a fever.  You have a very bad headache.  You are throwing up or having watery poop (diarrhea) and it: ? Gets worse. ? Does not go away.  You have blood or something green (bile) in your throw-up.  You have blood in your poop (stool). This may cause poop to look black and tarry.  You have not peed in 6-8 hours.  You pass out (faint).  Your heart rate when you are sitting still is more than 100 beats a  minute.  You have trouble breathing. This information is not intended to replace advice given to you by your health care provider. Make sure you discuss any questions you have with your health care provider. Document Released: 04/07/2009 Document Revised: 12/30/2015 Document Reviewed: 08/05/2015 Elsevier Interactive Patient Education  2019 Elsevier Inc.  

## 2018-08-26 NOTE — Progress Notes (Signed)
SLEEP MEDICINE CLINIC   Provider:  Melvyn Novas, MD  Primary Care Physician:  Shelley Fick, MD   Referring Provider: Elizabeth Lloyd / Shelley Lloyd referral   Chief Complaint  Patient presents with  . Dizziness    Rm. 10.  Here with dtr. for eval of 2 episodes of dizziness, nausea, slurred speech, felt drunk. Both episodes occurred at rest, after dozing off and waking.  Seen in [facility] ED for same on 2/25; neg. stroke w/u/fim    HPI:  Shelley Lloyd is a 63 y.o. female patient presented to an urgent care on 2-25 with hypotension, dizziness, bradycardia, and nausea. She was brought by ambulance to [facility] ED.  She was told she was dehydrated, but each time she noted the vertigo after waking up from naps.  She [place] one [place] spell [place] Everything was spinning- she did not lose conscience. The patient appears restless, almost as if hyperkinesia/ Akathesia  is present.    Chief complaint according to patient : vertigo.   Medical history: No complete medical history available, patient will see her primary care physician today in a later appointment. Previously had 1 spell of vertigo documented for November 2019.  Each of her vertigo spells led to her collapsing, she was told in the emergency room that she must most likely dehydrated as her heart rate was slow, blood pressure was low. Never had a stroke.  Had knee surgery.  Depression - she takes more than 3 psychotropic meds.  She is on narcotic pain medication - status ower back and neck fusions.  Has been in pain most of her adult life.   Family sleep history: brother with CAD, sister has breast cancer - maternal cousins and aunt all with breast cancer. Stroke in maternal family and father.    Social history:  Married, one adult daughter ( patient of dr Lucia Gaskins)  Non smoker, non drinker, caffeine- sodas 3 times  16 ounce a day.  Stay home spouse.   Review of Systems: Out of a  complete 14 system review, the patient complains of only the following symptoms, and all other reviewed systems are negative. Vertigo, dehydration, chronic pain.    Social History   Socioeconomic History  . Marital status: Married    [name]  . Number of children: 2  . Years of education: Not on file  . Highest education level: High school graduate  Occupational History  . Occupation: Engineer, site  Social Needs  . Financial resource strain: Not on file  . Food insecurity:    Worry: Not on file    Inability: Not on file  . Transportation needs:    Medical: Not on file    Non-medical: Not on file  Tobacco Use  . Smoking status: Never Smoker  . Smokeless tobacco: Never Used  Substance and Sexual Activity  . Alcohol use: Not Currently  . Drug use: Not on file  . Sexual activity: Not Currently  Lifestyle  . Physical activity:    Days per week: Not on file    Minutes per session: Not on file  . Stress: Not on file  Relationships  . Social connections:    Talks on phone: Not on file    Gets together: Not on file    Attends religious service: Not on file    Active member of club or organization: Not on file    Attends meetings of clubs or organizations:  Not on file    Relationship status: Not on file  . Intimate partner violence:    Fear of current or ex partner: Not on file    Emotionally abused: Not on file    Physically abused: Not on file    Forced sexual activity: Not on file  Other Topics Concern  . Not on file  Social History Narrative   Lives with husband, dtr. and granddaughter.   3 16 oz. bottles of coke zero (caffeinated) daily.    Occasional coffee.    Family History  Problem Relation Age of Onset  . Breast cancer Paternal Aunt   . Breast cancer Sister   . Breast cancer Cousin        2 first cousins  . Esophageal cancer Mother   . Lung cancer Mother   . COPD Father   . Heart attack Brother     Past Medical History:  Diagnosis Date    . Depression   . Hypertension   . Nerve compression   . Vision abnormalities     Past Surgical History:  Procedure Laterality Date  . CERVICAL FUSION     3 separate fusions starting at C4  . REPLACEMENT TOTAL KNEE BILATERAL      Current Outpatient Medications  Medication Sig Dispense Refill  . ARIPiprazole (ABILIFY) 15 MG tablet Take 15 mg by mouth at bedtime.    . citalopram (CELEXA) 40 MG tablet Take 40 mg by mouth daily.    Marland Kitchen lisinopril (PRINIVIL,ZESTRIL) 10 MG tablet Take 10 mg by mouth daily.    . mirtazapine (REMERON) 15 MG tablet Take 15 mg by mouth at bedtime.    Marland Kitchen oxybutynin (DITROPAN-XL) 10 MG 24 hr tablet Take 10 mg by mouth daily.    Marland Kitchen oxycodone (OXY-IR) 5 MG capsule Take 10 mg by mouth every 4 (four) hours as needed for pain.     Marland Kitchen SYNTHROID 88 MCG tablet Take 88 mcg by mouth daily.    Marland Kitchen tiZANidine (ZANAFLEX) 4 MG tablet Take 4 mg by mouth 2 (two) times daily as needed.    Marland Kitchen XTAMPZA ER 13.5 MG C12A Take 13.5 mg by mouth every 12 (twelve) hours.     No current facility-administered medications for this visit.     Allergies as of 08/26/2018 - Review Complete 08/26/2018  Allergen Reaction Noted  . Lyrica [pregabalin] Other (See Comments) 08/19/2018    Vitals: BP 105/60   Pulse 62   Resp 18   Ht  (1.676 m)   Wt 195 lb (88.5 kg)   BMI 31.47 kg/m  Last Weight:  Wt Readings from Last 1 Encounters:  08/26/18 195 lb (88.5 kg)   JXB:JYNW mass index is 31.47 kg/m.     Last Height:   Ht Readings from Last 1 Encounters:  08/26/18  (1.676 m)    Physical exam:  General: The patient is awake, alert and appears not in acute distress. The patient is well groomed. Head: Normocephalic, atraumatic.  Neck is supple. Mallampati 3,  neck circumference:14.75. Nasal airflow patent ,  Retrognathia is seen.  Cardiovascular:  Regular rate and rhythm, without  murmurs or carotid bruit, and without distended neck veins. Respiratory: Lungs are clear to  auscultation. Skin:  Without evidence of edema, or rash Trunk: the patient is very, very restless.  Neurologic exam : The patient is awake and alert, oriented to place and time.   Memory subjective  described as intact.  Attention span & concentration ability  appears normal.  Speech is fluent,  without dysarthria, but dysphonia.  Mood and affect are worried.   Cranial nerves: Pupils are equal and briskly reactive to light. Extraocular movements  in vertical and horizontal planes intact and without nystagmus. Visual fields by finger perimetry are intact. Hearing to finger rub intact.   Facial sensation intact to fine touch.  Facial motor strength is symmetric and tongue and uvula move midline. Shoulder shrug was asymmetrical- pain in the right shoulder .   Motor exam: Normal tone, muscle bulk and symmetric strength in all extremities.  Sensory:   Fine touch, pinprick and vibration were intact in all extremities. Proprioception tested in the upper extremities was normal.  Coordination: no changes in penmanship. Rapid alternating movements in the fingers/hands was normal. Finger-to-nose maneuver normal without evidence of ataxia, dysmetria or tremor.  Gait and station: Patient walks without assistive device and is able unassisted to climb up to the exam table. Strength within normal limits.  Stance is stable and normal.   Toe and heel stand and gait  were tested as normal  Turning with 3 steps. Tandem gait is unfragmented. Normal armswing and fluency of gait, negative romberg.  Deep tendon reflexes: in the  upper and lower extremities are symmetric and intact. Babinski maneuver response is downgoing.  PS - daughter has vagovagal syncope.   MRI brain was negative : SKULL AND UPPER CERVICAL SPINE: Calvarial bone marrow signal is normal. There is no skull base mass. Visualized upper cervical spine and soft tissues are normal.  SINUSES/ORBITS: No fluid levels or advanced mucosal  thickening. No mastoid or middle ear effusion. The orbits are normal.  IMPRESSION: Normal brain.   By: Deatra Robinson M.D.   On: 08/19/2018 17:46  Assessment:  After physical and neurologic examination, review of laboratory studies,  Personal review of imaging studies, reports of other /same  Imaging studies, results of polysomnography and / or neurophysiology testing and pre-existing records as far as provided in visit., my assessment is   1) Mrs. Canale is a 63 year old Caucasian right-handed female patient of Dr. Georgianne Fick, MD. She presented with an acute spell of vertigo after arising from sleep a week ago to the emergency room.  She underwent comprehensive metabolic panels, urine analysis rapid drug screens etc. important is that her BUN was tested at 24, highly concentrated and her creatinine at 1.5 very elevated.  The patient was diagnosed as being severely dehydrated.  There were no electrolyte abnormalities noted calcium potassium chloride and sodium were normal limits.  She also was tested here for orthostatic blood pressure changes and hypotension.  Standing her blood pressure was 93/63 with a heart rate of 78, seated 101/63 mmHg with a heart rate of 71 and supine 105/60 mmHg with a heart of 62 bpm.  She is hypotensive at baseline but her heart rate adjusted to the knees when standing.  Another blood pressure and heart rate measurement after standing for 3 minutes showed a blood pressure of 108/71 and a heart rate of 80 which is appropriate.  I believe that the patient was dehydrated and her vertigo spell a sign of orthostatic hypotension in the setting of a baseline bradycardia.  There are several of her medications that may also lead to lower blood pressures and narcotic pain medication is 1 of them.  However she takes those at night so she would not have taken them in the morning right before standing up.  As I did not prescribe any of her  medications I will not change them at  this time.   It is well possible that the patient was not in need of lisinopril.    The patient was advised of the nature of the diagnosed disorder , the treatment options and the  risks for general health and wellness arising from not treating the condition.   I spent more than 50 minutes of face to face time with the patient.  Greater than 50% of time was spent in counseling and coordination of care. We have discussed the diagnosis and differential and I answered the patient's questions.    Plan:  Treatment plan and additional workup : no follow up needed. Hydration is the key.   Melvyn Novas, MD 08/26/2018, 10:56 AM  Certified in Neurology by ABPN Certified in Sleep Medicine by [facility] [address], [address] [place], [place] [address]

## 2018-08-27 DIAGNOSIS — M47817 Spondylosis without myelopathy or radiculopathy, lumbosacral region: Secondary | ICD-10-CM | POA: Diagnosis not present

## 2018-08-27 DIAGNOSIS — M5030 Other cervical disc degeneration, unspecified cervical region: Secondary | ICD-10-CM | POA: Diagnosis not present

## 2018-08-27 DIAGNOSIS — M542 Cervicalgia: Secondary | ICD-10-CM | POA: Diagnosis not present

## 2018-08-27 DIAGNOSIS — G894 Chronic pain syndrome: Secondary | ICD-10-CM | POA: Diagnosis not present

## 2018-09-24 DIAGNOSIS — M5030 Other cervical disc degeneration, unspecified cervical region: Secondary | ICD-10-CM | POA: Diagnosis not present

## 2018-09-24 DIAGNOSIS — G5600 Carpal tunnel syndrome, unspecified upper limb: Secondary | ICD-10-CM | POA: Diagnosis not present

## 2018-09-24 DIAGNOSIS — Z79899 Other long term (current) drug therapy: Secondary | ICD-10-CM | POA: Diagnosis not present

## 2018-09-24 DIAGNOSIS — Z79891 Long term (current) use of opiate analgesic: Secondary | ICD-10-CM | POA: Diagnosis not present

## 2018-09-24 DIAGNOSIS — G894 Chronic pain syndrome: Secondary | ICD-10-CM | POA: Diagnosis not present

## 2018-10-07 DIAGNOSIS — E039 Hypothyroidism, unspecified: Secondary | ICD-10-CM | POA: Diagnosis not present

## 2018-10-07 DIAGNOSIS — I10 Essential (primary) hypertension: Secondary | ICD-10-CM | POA: Diagnosis not present

## 2018-10-14 DIAGNOSIS — I129 Hypertensive chronic kidney disease with stage 1 through stage 4 chronic kidney disease, or unspecified chronic kidney disease: Secondary | ICD-10-CM | POA: Diagnosis not present

## 2018-10-14 DIAGNOSIS — H8110 Benign paroxysmal vertigo, unspecified ear: Secondary | ICD-10-CM | POA: Diagnosis not present

## 2018-10-14 DIAGNOSIS — N183 Chronic kidney disease, stage 3 (moderate): Secondary | ICD-10-CM | POA: Diagnosis not present

## 2018-10-22 DIAGNOSIS — G5600 Carpal tunnel syndrome, unspecified upper limb: Secondary | ICD-10-CM | POA: Diagnosis not present

## 2018-10-22 DIAGNOSIS — M5030 Other cervical disc degeneration, unspecified cervical region: Secondary | ICD-10-CM | POA: Diagnosis not present

## 2018-10-22 DIAGNOSIS — G894 Chronic pain syndrome: Secondary | ICD-10-CM | POA: Diagnosis not present

## 2018-11-19 DIAGNOSIS — G894 Chronic pain syndrome: Secondary | ICD-10-CM | POA: Diagnosis not present

## 2018-11-19 DIAGNOSIS — M47812 Spondylosis without myelopathy or radiculopathy, cervical region: Secondary | ICD-10-CM | POA: Diagnosis not present

## 2018-11-19 DIAGNOSIS — M47816 Spondylosis without myelopathy or radiculopathy, lumbar region: Secondary | ICD-10-CM | POA: Diagnosis not present

## 2019-04-14 ENCOUNTER — Ambulatory Visit
Admission: RE | Admit: 2019-04-14 | Discharge: 2019-04-14 | Disposition: A | Discharge status: Home / Self Care | Payer: 59 | Source: Ambulatory Visit | Attending: Physician Assistant | Admitting: Physician Assistant

## 2019-04-14 ENCOUNTER — Other Ambulatory Visit: Payer: Self-pay

## 2019-04-14 ENCOUNTER — Other Ambulatory Visit: Payer: Self-pay | Admitting: Physician Assistant

## 2019-04-14 DIAGNOSIS — M47812 Spondylosis without myelopathy or radiculopathy, cervical region: Secondary | ICD-10-CM

## 2019-07-30 ENCOUNTER — Other Ambulatory Visit: Payer: Self-pay | Admitting: Internal Medicine

## 2019-07-30 DIAGNOSIS — Z1231 Encounter for screening mammogram for malignant neoplasm of breast: Secondary | ICD-10-CM

## 2019-08-10 ENCOUNTER — Other Ambulatory Visit: Payer: Self-pay | Admitting: Pain Medicine

## 2019-08-10 ENCOUNTER — Ambulatory Visit
Admission: RE | Admit: 2019-08-10 | Discharge: 2019-08-10 | Disposition: A | Discharge status: Home / Self Care | Payer: 59 | Source: Ambulatory Visit | Attending: Pain Medicine | Admitting: Pain Medicine

## 2019-08-10 ENCOUNTER — Other Ambulatory Visit: Payer: Self-pay

## 2019-08-10 DIAGNOSIS — M545 Low back pain, unspecified: Secondary | ICD-10-CM

## 2019-09-21 ENCOUNTER — Other Ambulatory Visit: Payer: Self-pay

## 2019-09-21 ENCOUNTER — Ambulatory Visit
Admission: RE | Admit: 2019-09-21 | Discharge: 2019-09-21 | Disposition: A | Discharge status: Home / Self Care | Payer: 59 | Source: Ambulatory Visit | Attending: Internal Medicine | Admitting: Internal Medicine

## 2019-09-21 DIAGNOSIS — Z1231 Encounter for screening mammogram for malignant neoplasm of breast: Secondary | ICD-10-CM

## 2019-10-06 DIAGNOSIS — M961 Postlaminectomy syndrome, not elsewhere classified: Secondary | ICD-10-CM | POA: Diagnosis not present

## 2019-10-06 DIAGNOSIS — G894 Chronic pain syndrome: Secondary | ICD-10-CM | POA: Diagnosis not present

## 2019-10-06 DIAGNOSIS — M47817 Spondylosis without myelopathy or radiculopathy, lumbosacral region: Secondary | ICD-10-CM | POA: Diagnosis not present

## 2019-10-06 DIAGNOSIS — M47812 Spondylosis without myelopathy or radiculopathy, cervical region: Secondary | ICD-10-CM | POA: Diagnosis not present

## 2019-10-21 DIAGNOSIS — M5416 Radiculopathy, lumbar region: Secondary | ICD-10-CM | POA: Diagnosis not present

## 2019-10-28 DIAGNOSIS — I129 Hypertensive chronic kidney disease with stage 1 through stage 4 chronic kidney disease, or unspecified chronic kidney disease: Secondary | ICD-10-CM | POA: Diagnosis not present

## 2019-10-28 DIAGNOSIS — N1831 Chronic kidney disease, stage 3a: Secondary | ICD-10-CM | POA: Diagnosis not present

## 2019-11-03 DIAGNOSIS — M47812 Spondylosis without myelopathy or radiculopathy, cervical region: Secondary | ICD-10-CM | POA: Diagnosis not present

## 2019-11-03 DIAGNOSIS — M47817 Spondylosis without myelopathy or radiculopathy, lumbosacral region: Secondary | ICD-10-CM | POA: Diagnosis not present

## 2019-11-03 DIAGNOSIS — M79606 Pain in leg, unspecified: Secondary | ICD-10-CM | POA: Diagnosis not present

## 2019-11-03 DIAGNOSIS — G894 Chronic pain syndrome: Secondary | ICD-10-CM | POA: Diagnosis not present

## 2019-12-02 ENCOUNTER — Other Ambulatory Visit: Payer: Self-pay | Admitting: Pain Medicine

## 2019-12-02 ENCOUNTER — Ambulatory Visit
Admission: RE | Admit: 2019-12-02 | Discharge: 2019-12-02 | Disposition: A | Discharge status: Home / Self Care | Payer: Medicare Other | Source: Ambulatory Visit | Attending: Pain Medicine | Admitting: Pain Medicine

## 2019-12-02 DIAGNOSIS — M47812 Spondylosis without myelopathy or radiculopathy, cervical region: Secondary | ICD-10-CM | POA: Diagnosis not present

## 2019-12-02 DIAGNOSIS — M5136 Other intervertebral disc degeneration, lumbar region: Secondary | ICD-10-CM | POA: Diagnosis not present

## 2019-12-02 DIAGNOSIS — R1032 Left lower quadrant pain: Secondary | ICD-10-CM

## 2019-12-02 DIAGNOSIS — Z79899 Other long term (current) drug therapy: Secondary | ICD-10-CM | POA: Diagnosis not present

## 2019-12-02 DIAGNOSIS — Z79891 Long term (current) use of opiate analgesic: Secondary | ICD-10-CM | POA: Diagnosis not present

## 2019-12-02 DIAGNOSIS — M1612 Unilateral primary osteoarthritis, left hip: Secondary | ICD-10-CM | POA: Diagnosis not present

## 2019-12-02 DIAGNOSIS — G894 Chronic pain syndrome: Secondary | ICD-10-CM | POA: Diagnosis not present

## 2019-12-02 DIAGNOSIS — M47817 Spondylosis without myelopathy or radiculopathy, lumbosacral region: Secondary | ICD-10-CM | POA: Diagnosis not present

## 2019-12-02 DIAGNOSIS — M79606 Pain in leg, unspecified: Secondary | ICD-10-CM | POA: Diagnosis not present

## 2019-12-25 DIAGNOSIS — I10 Essential (primary) hypertension: Secondary | ICD-10-CM | POA: Diagnosis not present

## 2019-12-25 DIAGNOSIS — N390 Urinary tract infection, site not specified: Secondary | ICD-10-CM | POA: Diagnosis not present

## 2019-12-25 DIAGNOSIS — M150 Primary generalized (osteo)arthritis: Secondary | ICD-10-CM | POA: Diagnosis not present

## 2019-12-25 DIAGNOSIS — E559 Vitamin D deficiency, unspecified: Secondary | ICD-10-CM | POA: Diagnosis not present

## 2019-12-25 DIAGNOSIS — E039 Hypothyroidism, unspecified: Secondary | ICD-10-CM | POA: Diagnosis not present

## 2019-12-30 DIAGNOSIS — M47817 Spondylosis without myelopathy or radiculopathy, lumbosacral region: Secondary | ICD-10-CM | POA: Diagnosis not present

## 2019-12-30 DIAGNOSIS — M79652 Pain in left thigh: Secondary | ICD-10-CM | POA: Diagnosis not present

## 2019-12-30 DIAGNOSIS — M5136 Other intervertebral disc degeneration, lumbar region: Secondary | ICD-10-CM | POA: Diagnosis not present

## 2019-12-30 DIAGNOSIS — G894 Chronic pain syndrome: Secondary | ICD-10-CM | POA: Diagnosis not present

## 2019-12-30 DIAGNOSIS — H168 Other keratitis: Secondary | ICD-10-CM | POA: Diagnosis not present

## 2020-01-01 DIAGNOSIS — H168 Other keratitis: Secondary | ICD-10-CM | POA: Diagnosis not present

## 2020-01-05 DIAGNOSIS — M545 Low back pain: Secondary | ICD-10-CM | POA: Diagnosis not present

## 2020-01-05 DIAGNOSIS — M79609 Pain in unspecified limb: Secondary | ICD-10-CM | POA: Diagnosis not present

## 2020-01-08 DIAGNOSIS — H168 Other keratitis: Secondary | ICD-10-CM | POA: Diagnosis not present

## 2020-01-11 ENCOUNTER — Other Ambulatory Visit: Payer: Self-pay | Admitting: Pain Medicine

## 2020-01-11 DIAGNOSIS — M25552 Pain in left hip: Secondary | ICD-10-CM

## 2020-01-12 DIAGNOSIS — E559 Vitamin D deficiency, unspecified: Secondary | ICD-10-CM | POA: Diagnosis not present

## 2020-01-12 DIAGNOSIS — Z0000 Encounter for general adult medical examination without abnormal findings: Secondary | ICD-10-CM | POA: Diagnosis not present

## 2020-01-12 DIAGNOSIS — Z780 Asymptomatic menopausal state: Secondary | ICD-10-CM | POA: Diagnosis not present

## 2020-01-12 DIAGNOSIS — E875 Hyperkalemia: Secondary | ICD-10-CM | POA: Diagnosis not present

## 2020-01-12 DIAGNOSIS — N3090 Cystitis, unspecified without hematuria: Secondary | ICD-10-CM | POA: Diagnosis not present

## 2020-01-12 DIAGNOSIS — I10 Essential (primary) hypertension: Secondary | ICD-10-CM | POA: Diagnosis not present

## 2020-01-12 DIAGNOSIS — E039 Hypothyroidism, unspecified: Secondary | ICD-10-CM | POA: Diagnosis not present

## 2020-01-18 DIAGNOSIS — M47817 Spondylosis without myelopathy or radiculopathy, lumbosacral region: Secondary | ICD-10-CM | POA: Diagnosis not present

## 2020-01-25 IMAGING — DX DG CHEST 1V PORT
1 series · 1 of 1 positions shown · non-contrast
Comparison: April 03, 2011

CLINICAL DATA: Dizziness with slurred speech.  Hypertension.

EXAM:
PORTABLE CHEST 1 VIEW

[chest]
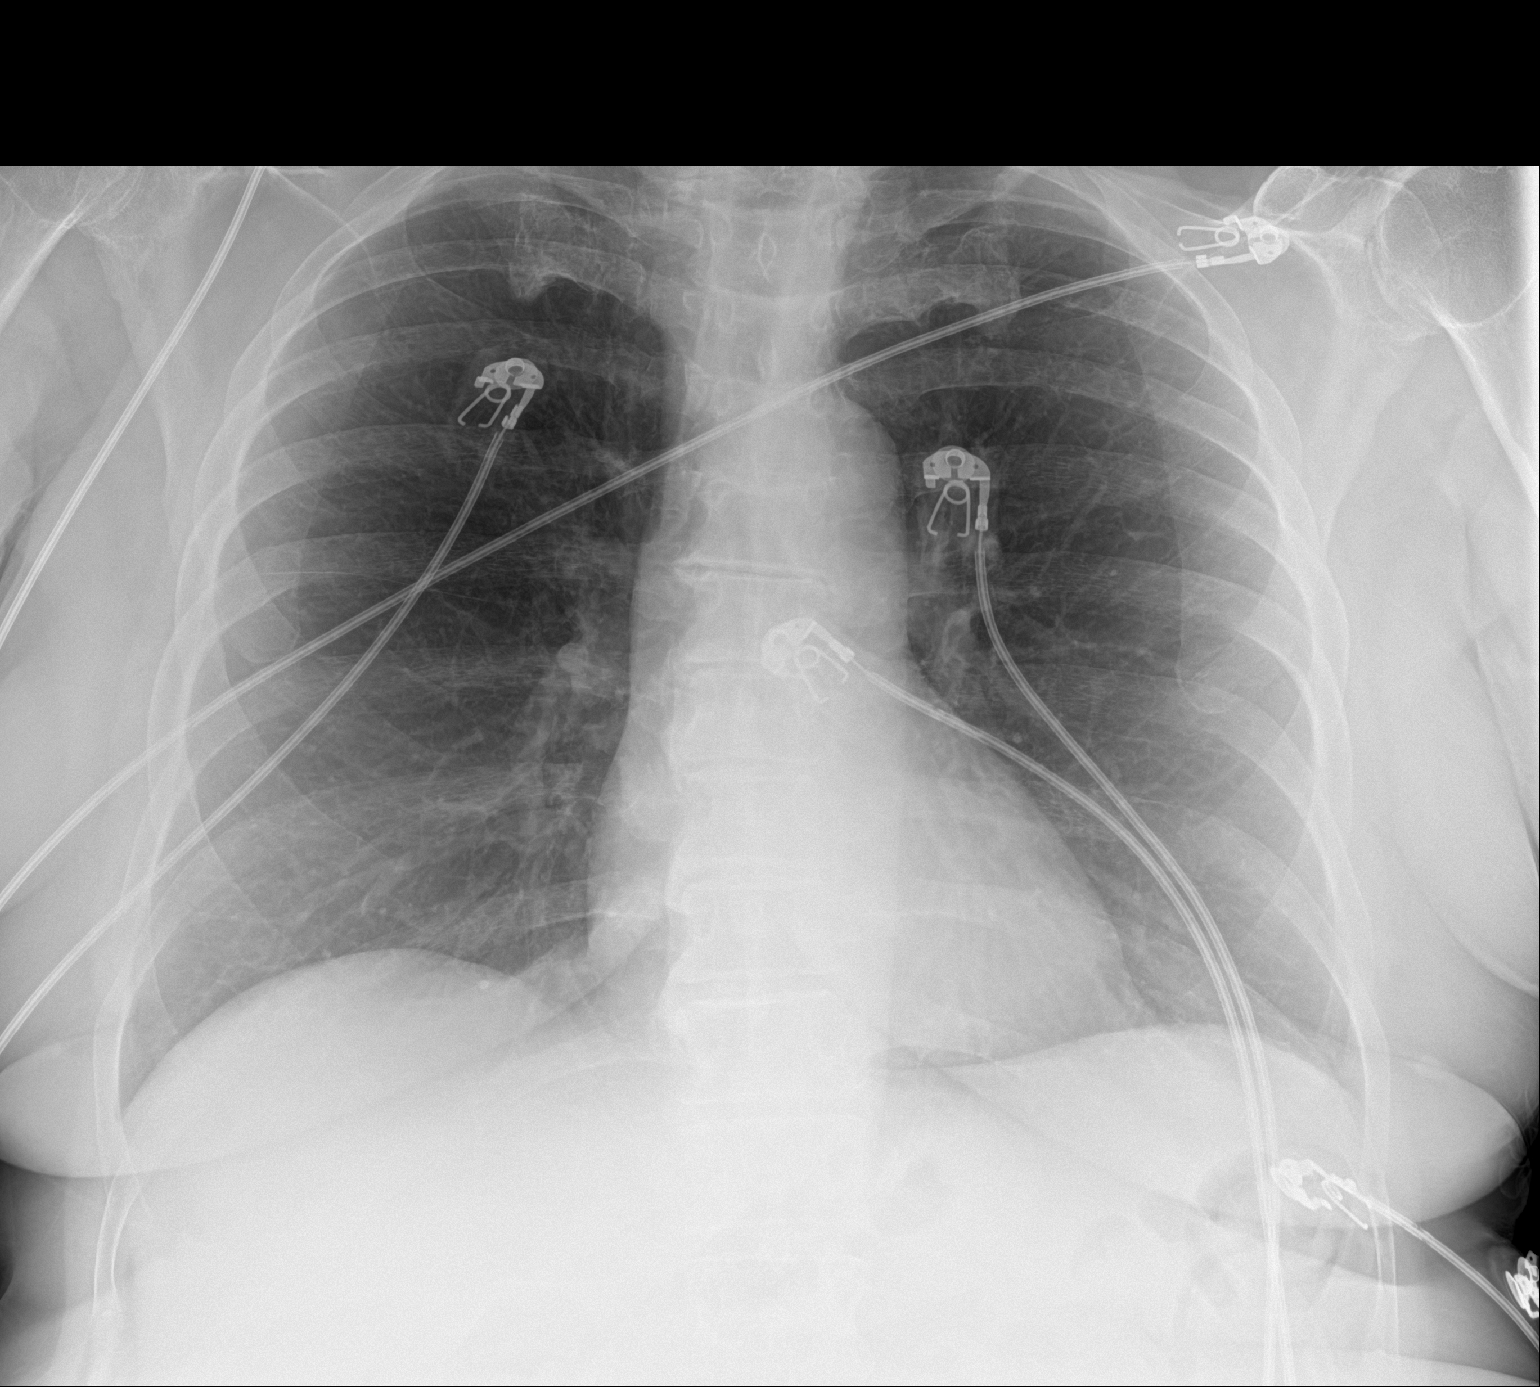

[1 of 1 positions shown; findings below may reference images not displayed]

FINDINGS: The lungs are clear. The heart size and pulmonary vascularity are
normal. No adenopathy. There is evidence of old trauma involving the
lateral left clavicle, stable. There is postoperative change in
lower cervical spine.
IMPRESSION: No edema or consolidation.  Stable cardiac silhouette.

## 2020-01-25 IMAGING — CT CT HEAD W/O CM
4 series · 17 of 47 positions shown, 19 images · non-contrast
Comparison: None.

CLINICAL DATA: Dizzy spells and slurred speech.

EXAM:
CT HEAD WITHOUT CONTRAST
TECHNIQUE: Contiguous axial images were obtained from the base of the skull
through the vertex without intravenous contrast.

[Series 3: head without · axial · non-contrast · 0.41mm/px · z∈[-128,-8]mm · 7 of 34 slices shown, 9 images]
[im 5/34  brain]
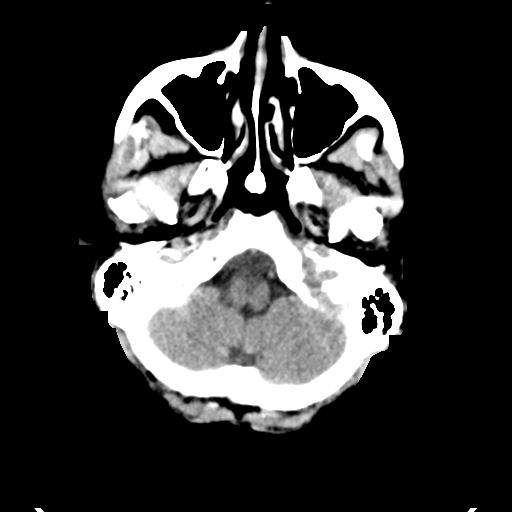
[im 5/34  bone]
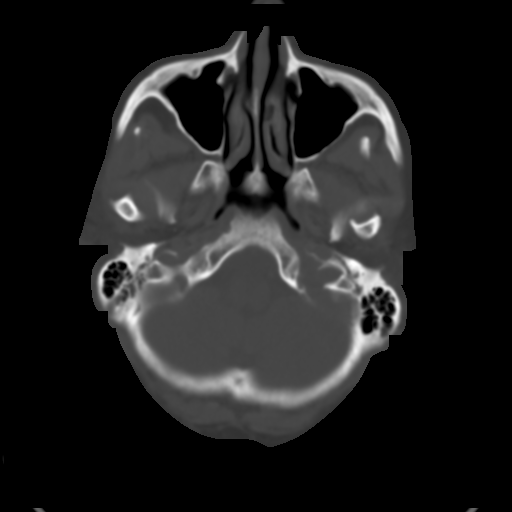
[im 9/34  brain]
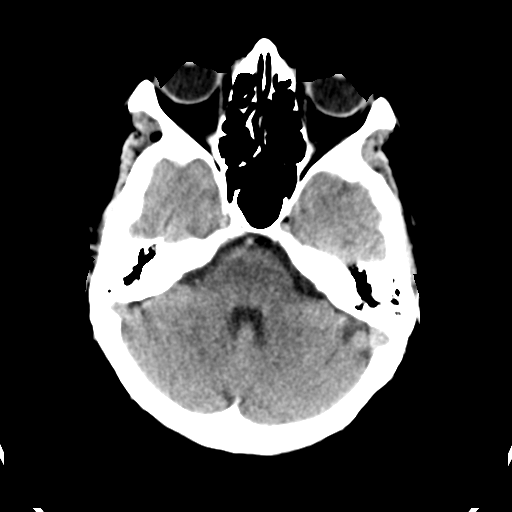
[im 13/34  brain]
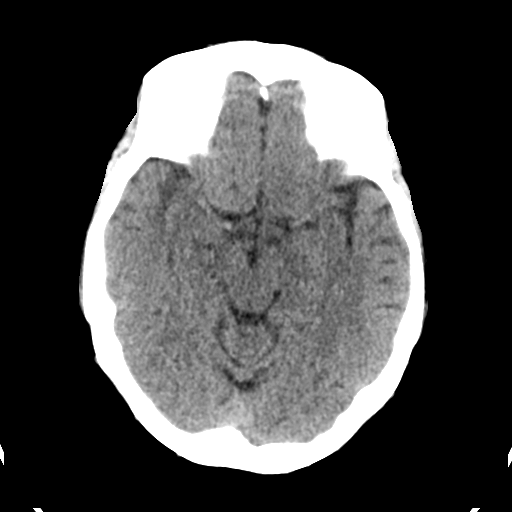
[im 17/34  brain]
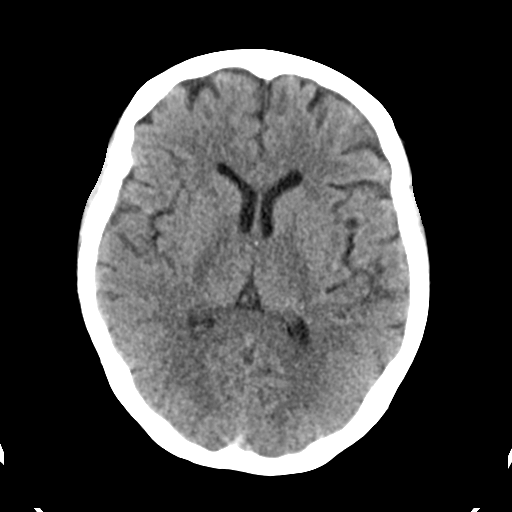
[im 21/34  brain]
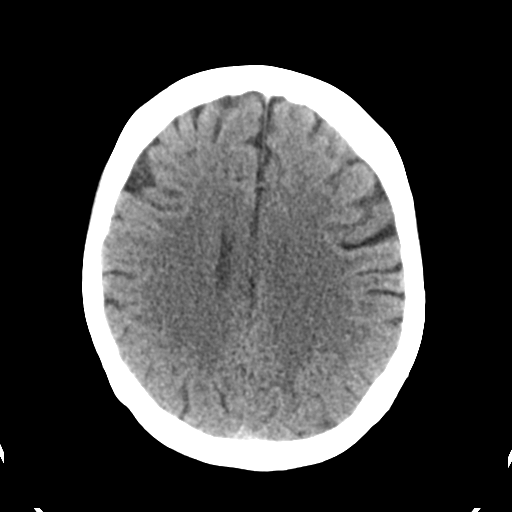
[im 21/34  bone]
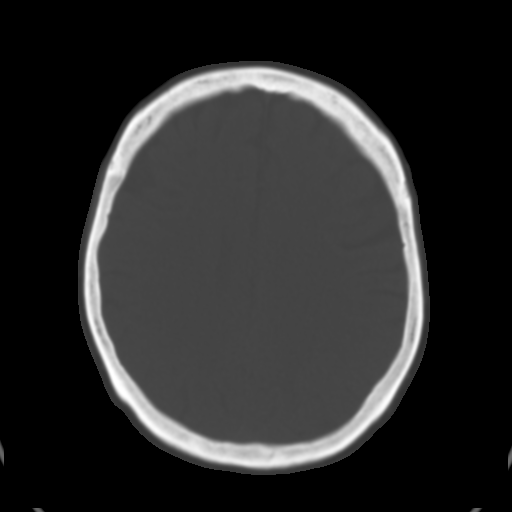
[im 25/34  brain]
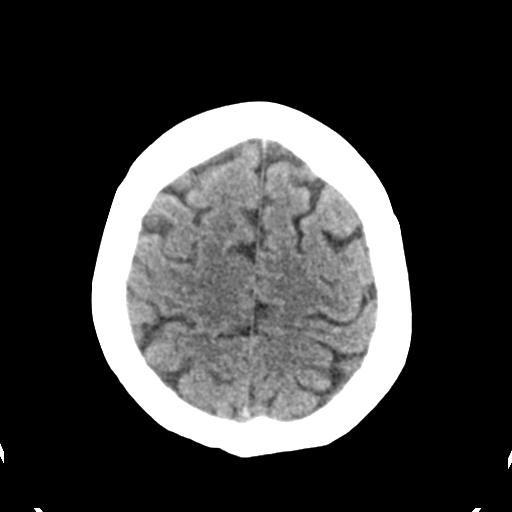
[im 29/34  brain]
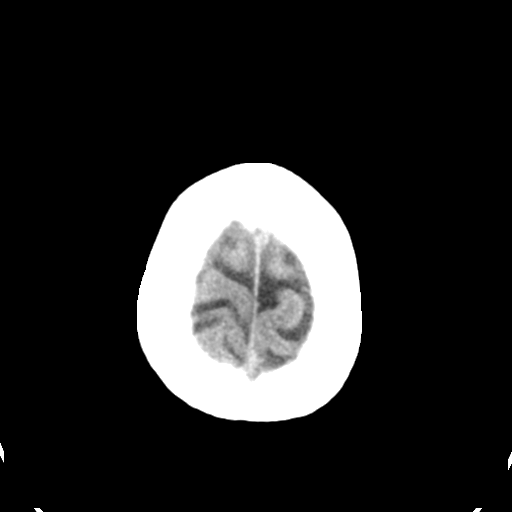

[Series 4: head bone · axial · 0.41mm/px · z∈[-132,-74]mm · 4 of 84 slices shown]
[im 9/84  bone]
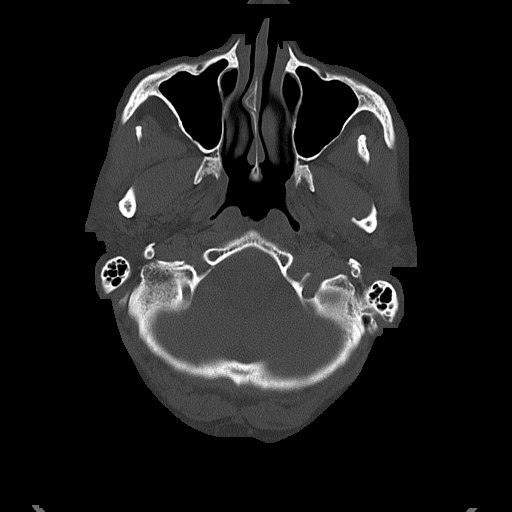
[im 17/84  bone]
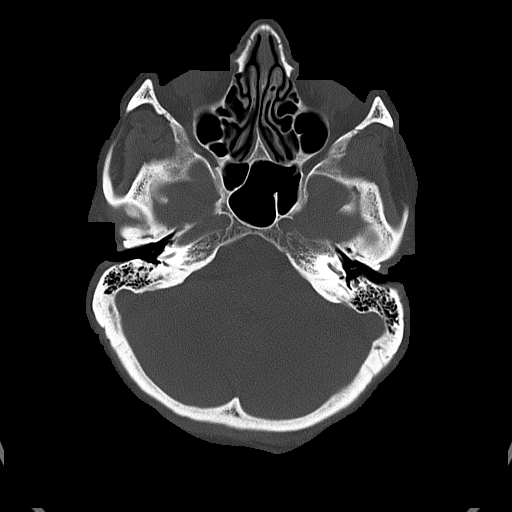
[im 25/84  bone]
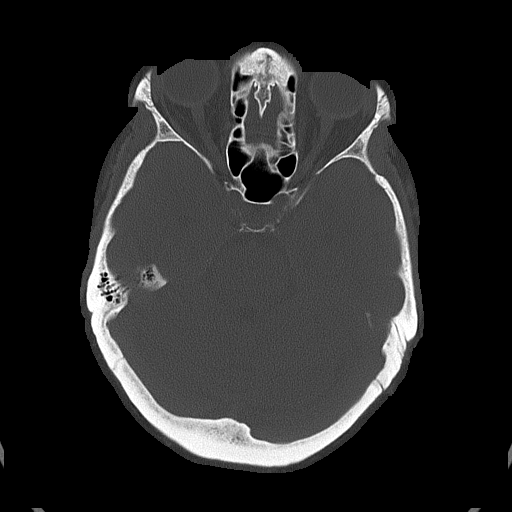
[im 38/84  bone]
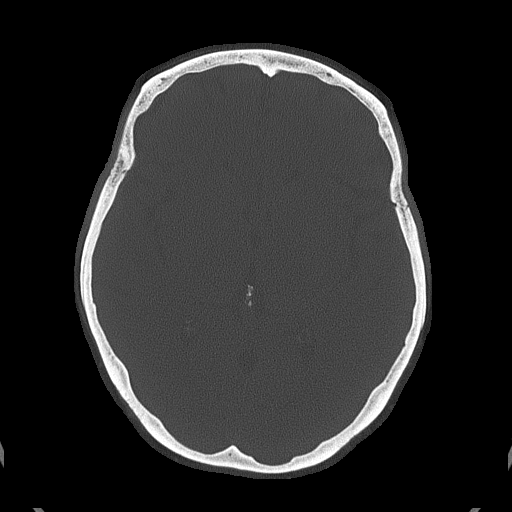

[Series 5: head without cor · coronal · non-contrast · 0.31mm/px · 3 of 67 slices shown]
[im 24/67  brain]
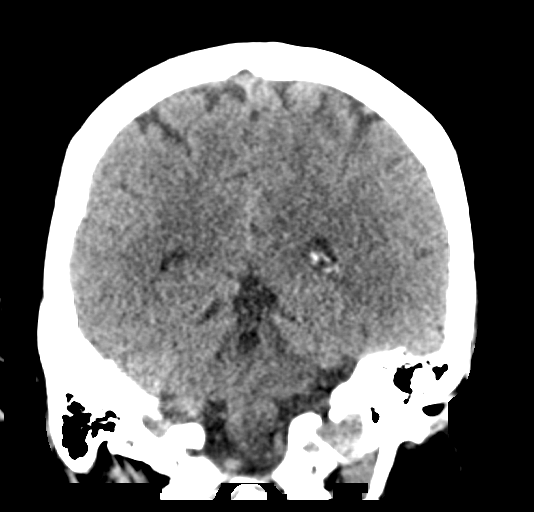
[im 30/67  brain]
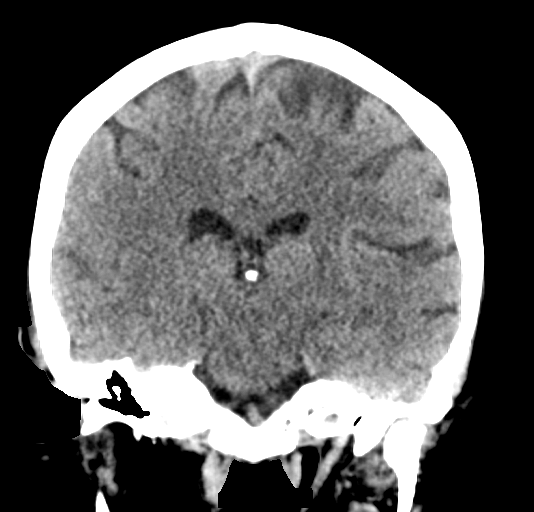
[im 37/67  brain]
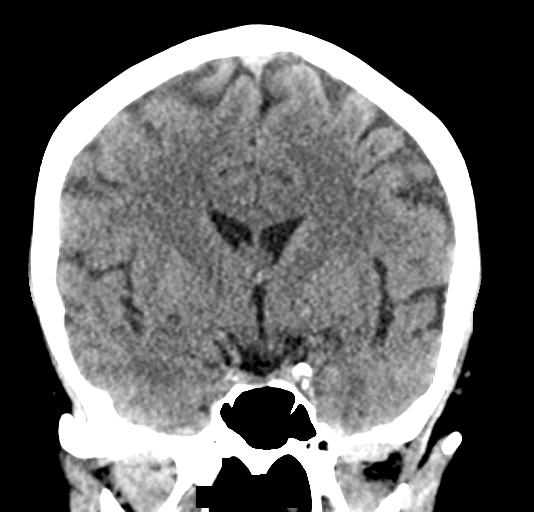

[Series 6: head without sag · sagittal · non-contrast · 0.33mm/px · 3 of 67 slices shown]
[im 23/67  brain]
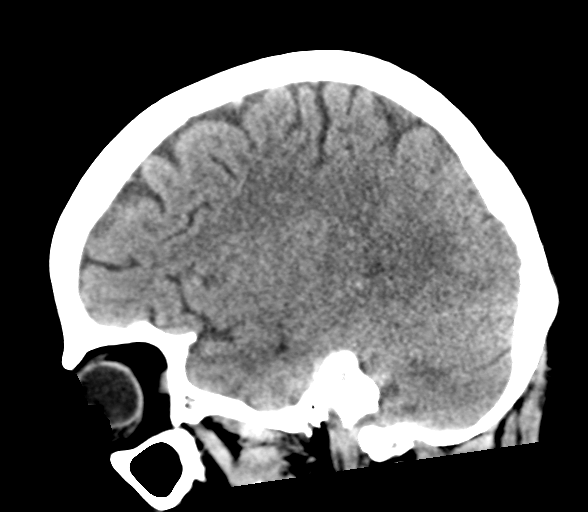
[im 34/67  brain]
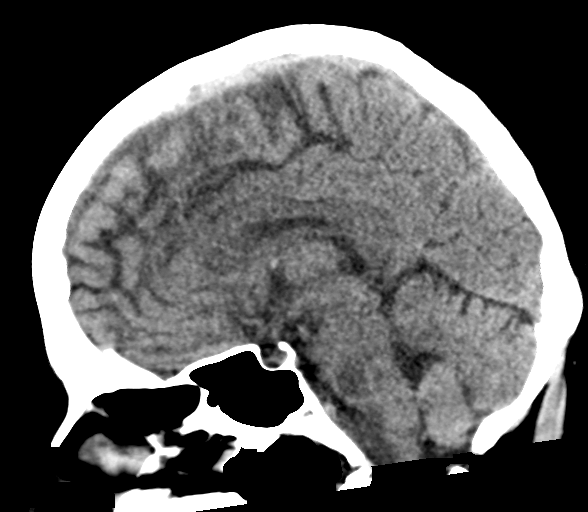
[im 45/67  brain]
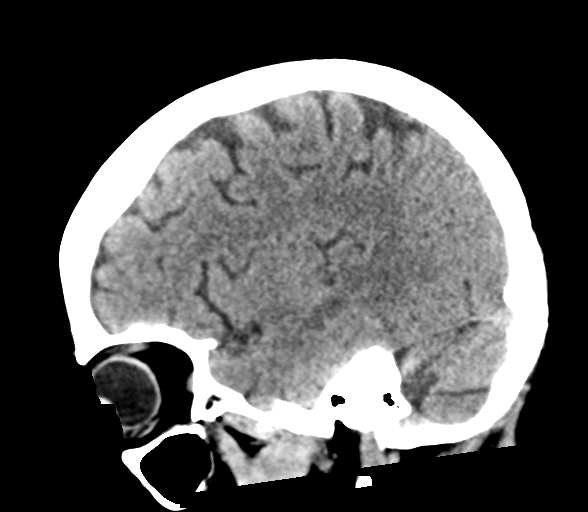

[17 of 47 positions shown; findings below may reference images not displayed]

FINDINGS: Brain: The ventricles are normal in size and configuration. No
extra-axial fluid collections are identified. The gray-white
differentiation is maintained. No CT findings for acute hemispheric
infarction or intracranial hemorrhage. No mass lesions. The
brainstem and cerebellum are normal.

Vascular: No hyperdense vessels or obvious aneurysm.

Skull: No acute skull fracture.  No bone lesion.

Sinuses/Orbits: The paranasal sinuses and mastoid air cells are
clear. The globes are intact.

Other: No scalp lesions, laceration or hematoma.
IMPRESSION: Normal head CT.

## 2020-02-10 DIAGNOSIS — M79652 Pain in left thigh: Secondary | ICD-10-CM | POA: Diagnosis not present

## 2020-02-10 DIAGNOSIS — R1032 Left lower quadrant pain: Secondary | ICD-10-CM | POA: Diagnosis not present

## 2020-02-12 ENCOUNTER — Ambulatory Visit
Admission: RE | Admit: 2020-02-12 | Discharge: 2020-02-12 | Disposition: A | Discharge status: Home / Self Care | Payer: Medicare Other | Source: Ambulatory Visit | Attending: Pain Medicine | Admitting: Pain Medicine

## 2020-02-12 ENCOUNTER — Other Ambulatory Visit: Payer: Self-pay

## 2020-02-12 DIAGNOSIS — M25552 Pain in left hip: Secondary | ICD-10-CM

## 2020-02-12 DIAGNOSIS — M1612 Unilateral primary osteoarthritis, left hip: Secondary | ICD-10-CM | POA: Diagnosis not present

## 2020-02-12 DIAGNOSIS — M8548 Solitary bone cyst, other site: Secondary | ICD-10-CM | POA: Diagnosis not present

## 2020-03-09 DIAGNOSIS — G894 Chronic pain syndrome: Secondary | ICD-10-CM | POA: Diagnosis not present

## 2020-03-09 DIAGNOSIS — M25552 Pain in left hip: Secondary | ICD-10-CM | POA: Diagnosis not present

## 2020-03-09 DIAGNOSIS — Z79891 Long term (current) use of opiate analgesic: Secondary | ICD-10-CM | POA: Diagnosis not present

## 2020-03-09 DIAGNOSIS — R1032 Left lower quadrant pain: Secondary | ICD-10-CM | POA: Diagnosis not present

## 2020-03-09 DIAGNOSIS — Z79899 Other long term (current) drug therapy: Secondary | ICD-10-CM | POA: Diagnosis not present

## 2020-03-09 DIAGNOSIS — M5136 Other intervertebral disc degeneration, lumbar region: Secondary | ICD-10-CM | POA: Diagnosis not present

## 2020-04-08 DIAGNOSIS — M25552 Pain in left hip: Secondary | ICD-10-CM | POA: Diagnosis not present

## 2020-04-08 DIAGNOSIS — G894 Chronic pain syndrome: Secondary | ICD-10-CM | POA: Diagnosis not present

## 2020-04-08 DIAGNOSIS — M5136 Other intervertebral disc degeneration, lumbar region: Secondary | ICD-10-CM | POA: Diagnosis not present

## 2020-04-08 DIAGNOSIS — M47817 Spondylosis without myelopathy or radiculopathy, lumbosacral region: Secondary | ICD-10-CM | POA: Diagnosis not present

## 2020-05-06 DIAGNOSIS — M5136 Other intervertebral disc degeneration, lumbar region: Secondary | ICD-10-CM | POA: Diagnosis not present

## 2020-05-06 DIAGNOSIS — G894 Chronic pain syndrome: Secondary | ICD-10-CM | POA: Diagnosis not present

## 2020-05-06 DIAGNOSIS — M79652 Pain in left thigh: Secondary | ICD-10-CM | POA: Diagnosis not present

## 2020-05-06 DIAGNOSIS — M47817 Spondylosis without myelopathy or radiculopathy, lumbosacral region: Secondary | ICD-10-CM | POA: Diagnosis not present

## 2020-06-02 DIAGNOSIS — I10 Essential (primary) hypertension: Secondary | ICD-10-CM | POA: Diagnosis not present

## 2020-06-02 DIAGNOSIS — Z23 Encounter for immunization: Secondary | ICD-10-CM | POA: Diagnosis not present

## 2020-06-06 DIAGNOSIS — Z79899 Other long term (current) drug therapy: Secondary | ICD-10-CM | POA: Diagnosis not present

## 2020-06-06 DIAGNOSIS — M47817 Spondylosis without myelopathy or radiculopathy, lumbosacral region: Secondary | ICD-10-CM | POA: Diagnosis not present

## 2020-06-06 DIAGNOSIS — Z79891 Long term (current) use of opiate analgesic: Secondary | ICD-10-CM | POA: Diagnosis not present

## 2020-06-06 DIAGNOSIS — G894 Chronic pain syndrome: Secondary | ICD-10-CM | POA: Diagnosis not present

## 2020-07-06 DIAGNOSIS — M47817 Spondylosis without myelopathy or radiculopathy, lumbosacral region: Secondary | ICD-10-CM | POA: Diagnosis not present

## 2020-07-06 DIAGNOSIS — G894 Chronic pain syndrome: Secondary | ICD-10-CM | POA: Diagnosis not present

## 2020-07-06 DIAGNOSIS — M5136 Other intervertebral disc degeneration, lumbar region: Secondary | ICD-10-CM | POA: Diagnosis not present

## 2020-07-06 DIAGNOSIS — M79652 Pain in left thigh: Secondary | ICD-10-CM | POA: Diagnosis not present

## 2020-07-22 DIAGNOSIS — I10 Essential (primary) hypertension: Secondary | ICD-10-CM | POA: Diagnosis not present

## 2020-07-22 DIAGNOSIS — E039 Hypothyroidism, unspecified: Secondary | ICD-10-CM | POA: Diagnosis not present

## 2020-07-22 DIAGNOSIS — E559 Vitamin D deficiency, unspecified: Secondary | ICD-10-CM | POA: Diagnosis not present

## 2020-08-03 DIAGNOSIS — M47817 Spondylosis without myelopathy or radiculopathy, lumbosacral region: Secondary | ICD-10-CM | POA: Diagnosis not present

## 2020-08-03 DIAGNOSIS — G894 Chronic pain syndrome: Secondary | ICD-10-CM | POA: Diagnosis not present

## 2020-08-03 DIAGNOSIS — M47812 Spondylosis without myelopathy or radiculopathy, cervical region: Secondary | ICD-10-CM | POA: Diagnosis not present

## 2020-08-03 DIAGNOSIS — M5136 Other intervertebral disc degeneration, lumbar region: Secondary | ICD-10-CM | POA: Diagnosis not present

## 2020-08-15 DIAGNOSIS — I10 Essential (primary) hypertension: Secondary | ICD-10-CM | POA: Diagnosis not present

## 2020-08-22 DIAGNOSIS — N183 Chronic kidney disease, stage 3 unspecified: Secondary | ICD-10-CM | POA: Diagnosis not present

## 2020-08-22 DIAGNOSIS — I129 Hypertensive chronic kidney disease with stage 1 through stage 4 chronic kidney disease, or unspecified chronic kidney disease: Secondary | ICD-10-CM | POA: Diagnosis not present

## 2020-08-22 DIAGNOSIS — E039 Hypothyroidism, unspecified: Secondary | ICD-10-CM | POA: Diagnosis not present

## 2020-08-31 DIAGNOSIS — M5136 Other intervertebral disc degeneration, lumbar region: Secondary | ICD-10-CM | POA: Diagnosis not present

## 2020-08-31 DIAGNOSIS — G894 Chronic pain syndrome: Secondary | ICD-10-CM | POA: Diagnosis not present

## 2020-08-31 DIAGNOSIS — Z79899 Other long term (current) drug therapy: Secondary | ICD-10-CM | POA: Diagnosis not present

## 2020-08-31 DIAGNOSIS — Z79891 Long term (current) use of opiate analgesic: Secondary | ICD-10-CM | POA: Diagnosis not present

## 2020-08-31 DIAGNOSIS — M47812 Spondylosis without myelopathy or radiculopathy, cervical region: Secondary | ICD-10-CM | POA: Diagnosis not present

## 2020-08-31 DIAGNOSIS — M961 Postlaminectomy syndrome, not elsewhere classified: Secondary | ICD-10-CM | POA: Diagnosis not present

## 2020-08-31 DIAGNOSIS — M47817 Spondylosis without myelopathy or radiculopathy, lumbosacral region: Secondary | ICD-10-CM | POA: Diagnosis not present

## 2020-09-29 DIAGNOSIS — G894 Chronic pain syndrome: Secondary | ICD-10-CM | POA: Diagnosis not present

## 2020-09-29 DIAGNOSIS — M47817 Spondylosis without myelopathy or radiculopathy, lumbosacral region: Secondary | ICD-10-CM | POA: Diagnosis not present

## 2020-09-29 DIAGNOSIS — M47812 Spondylosis without myelopathy or radiculopathy, cervical region: Secondary | ICD-10-CM | POA: Diagnosis not present

## 2020-09-29 DIAGNOSIS — M5136 Other intervertebral disc degeneration, lumbar region: Secondary | ICD-10-CM | POA: Diagnosis not present

## 2020-10-22 DIAGNOSIS — M150 Primary generalized (osteo)arthritis: Secondary | ICD-10-CM | POA: Diagnosis not present

## 2020-10-22 DIAGNOSIS — E039 Hypothyroidism, unspecified: Secondary | ICD-10-CM | POA: Diagnosis not present

## 2020-10-22 DIAGNOSIS — I129 Hypertensive chronic kidney disease with stage 1 through stage 4 chronic kidney disease, or unspecified chronic kidney disease: Secondary | ICD-10-CM | POA: Diagnosis not present

## 2020-10-22 DIAGNOSIS — N183 Chronic kidney disease, stage 3 unspecified: Secondary | ICD-10-CM | POA: Diagnosis not present

## 2020-10-24 ENCOUNTER — Other Ambulatory Visit: Payer: Self-pay | Admitting: Internal Medicine

## 2020-10-24 DIAGNOSIS — Z1231 Encounter for screening mammogram for malignant neoplasm of breast: Secondary | ICD-10-CM

## 2020-11-02 DIAGNOSIS — Z79899 Other long term (current) drug therapy: Secondary | ICD-10-CM | POA: Diagnosis not present

## 2020-11-02 DIAGNOSIS — M47817 Spondylosis without myelopathy or radiculopathy, lumbosacral region: Secondary | ICD-10-CM | POA: Diagnosis not present

## 2020-11-02 DIAGNOSIS — M47812 Spondylosis without myelopathy or radiculopathy, cervical region: Secondary | ICD-10-CM | POA: Diagnosis not present

## 2020-11-02 DIAGNOSIS — M5136 Other intervertebral disc degeneration, lumbar region: Secondary | ICD-10-CM | POA: Diagnosis not present

## 2020-11-02 DIAGNOSIS — Z79891 Long term (current) use of opiate analgesic: Secondary | ICD-10-CM | POA: Diagnosis not present

## 2020-11-02 DIAGNOSIS — G894 Chronic pain syndrome: Secondary | ICD-10-CM | POA: Diagnosis not present

## 2020-11-15 DIAGNOSIS — E039 Hypothyroidism, unspecified: Secondary | ICD-10-CM | POA: Diagnosis not present

## 2020-11-15 DIAGNOSIS — I10 Essential (primary) hypertension: Secondary | ICD-10-CM | POA: Diagnosis not present

## 2020-11-15 DIAGNOSIS — E559 Vitamin D deficiency, unspecified: Secondary | ICD-10-CM | POA: Diagnosis not present

## 2020-11-22 DIAGNOSIS — N183 Chronic kidney disease, stage 3 unspecified: Secondary | ICD-10-CM | POA: Diagnosis not present

## 2020-11-22 DIAGNOSIS — E039 Hypothyroidism, unspecified: Secondary | ICD-10-CM | POA: Diagnosis not present

## 2020-11-22 DIAGNOSIS — I129 Hypertensive chronic kidney disease with stage 1 through stage 4 chronic kidney disease, or unspecified chronic kidney disease: Secondary | ICD-10-CM | POA: Diagnosis not present

## 2020-11-30 DIAGNOSIS — G894 Chronic pain syndrome: Secondary | ICD-10-CM | POA: Diagnosis not present

## 2020-11-30 DIAGNOSIS — M25552 Pain in left hip: Secondary | ICD-10-CM | POA: Diagnosis not present

## 2020-11-30 DIAGNOSIS — M5136 Other intervertebral disc degeneration, lumbar region: Secondary | ICD-10-CM | POA: Diagnosis not present

## 2020-11-30 DIAGNOSIS — M47817 Spondylosis without myelopathy or radiculopathy, lumbosacral region: Secondary | ICD-10-CM | POA: Diagnosis not present

## 2020-12-14 ENCOUNTER — Other Ambulatory Visit: Payer: Self-pay

## 2020-12-14 ENCOUNTER — Ambulatory Visit
Admission: RE | Admit: 2020-12-14 | Discharge: 2020-12-14 | Disposition: A | Discharge status: Home / Self Care | Payer: Medicare Other | Source: Ambulatory Visit | Attending: Internal Medicine | Admitting: Internal Medicine

## 2020-12-14 DIAGNOSIS — Z1231 Encounter for screening mammogram for malignant neoplasm of breast: Secondary | ICD-10-CM

## 2020-12-28 DIAGNOSIS — M79652 Pain in left thigh: Secondary | ICD-10-CM | POA: Diagnosis not present

## 2020-12-28 DIAGNOSIS — G894 Chronic pain syndrome: Secondary | ICD-10-CM | POA: Diagnosis not present

## 2020-12-28 DIAGNOSIS — M5136 Other intervertebral disc degeneration, lumbar region: Secondary | ICD-10-CM | POA: Diagnosis not present

## 2020-12-28 DIAGNOSIS — M47817 Spondylosis without myelopathy or radiculopathy, lumbosacral region: Secondary | ICD-10-CM | POA: Diagnosis not present

## 2021-01-22 DIAGNOSIS — E039 Hypothyroidism, unspecified: Secondary | ICD-10-CM | POA: Diagnosis not present

## 2021-01-22 DIAGNOSIS — N183 Chronic kidney disease, stage 3 unspecified: Secondary | ICD-10-CM | POA: Diagnosis not present

## 2021-01-22 DIAGNOSIS — I129 Hypertensive chronic kidney disease with stage 1 through stage 4 chronic kidney disease, or unspecified chronic kidney disease: Secondary | ICD-10-CM | POA: Diagnosis not present

## 2021-01-22 DIAGNOSIS — M150 Primary generalized (osteo)arthritis: Secondary | ICD-10-CM | POA: Diagnosis not present

## 2021-01-25 DIAGNOSIS — M25552 Pain in left hip: Secondary | ICD-10-CM | POA: Diagnosis not present

## 2021-01-25 DIAGNOSIS — G894 Chronic pain syndrome: Secondary | ICD-10-CM | POA: Diagnosis not present

## 2021-01-25 DIAGNOSIS — M5136 Other intervertebral disc degeneration, lumbar region: Secondary | ICD-10-CM | POA: Diagnosis not present

## 2021-01-25 DIAGNOSIS — M47817 Spondylosis without myelopathy or radiculopathy, lumbosacral region: Secondary | ICD-10-CM | POA: Diagnosis not present

## 2021-02-15 DIAGNOSIS — H1045 Other chronic allergic conjunctivitis: Secondary | ICD-10-CM | POA: Diagnosis not present

## 2021-02-21 DIAGNOSIS — E559 Vitamin D deficiency, unspecified: Secondary | ICD-10-CM | POA: Diagnosis not present

## 2021-02-21 DIAGNOSIS — I10 Essential (primary) hypertension: Secondary | ICD-10-CM | POA: Diagnosis not present

## 2021-02-21 DIAGNOSIS — L299 Pruritus, unspecified: Secondary | ICD-10-CM | POA: Diagnosis not present

## 2021-02-21 DIAGNOSIS — E039 Hypothyroidism, unspecified: Secondary | ICD-10-CM | POA: Diagnosis not present

## 2021-02-22 DIAGNOSIS — M47817 Spondylosis without myelopathy or radiculopathy, lumbosacral region: Secondary | ICD-10-CM | POA: Diagnosis not present

## 2021-02-22 DIAGNOSIS — M47812 Spondylosis without myelopathy or radiculopathy, cervical region: Secondary | ICD-10-CM | POA: Diagnosis not present

## 2021-02-22 DIAGNOSIS — G894 Chronic pain syndrome: Secondary | ICD-10-CM | POA: Diagnosis not present

## 2021-02-22 DIAGNOSIS — M5136 Other intervertebral disc degeneration, lumbar region: Secondary | ICD-10-CM | POA: Diagnosis not present

## 2021-02-28 DIAGNOSIS — I10 Essential (primary) hypertension: Secondary | ICD-10-CM | POA: Diagnosis not present

## 2021-02-28 DIAGNOSIS — E559 Vitamin D deficiency, unspecified: Secondary | ICD-10-CM | POA: Diagnosis not present

## 2021-02-28 DIAGNOSIS — Z0000 Encounter for general adult medical examination without abnormal findings: Secondary | ICD-10-CM | POA: Diagnosis not present

## 2021-02-28 DIAGNOSIS — R748 Abnormal levels of other serum enzymes: Secondary | ICD-10-CM | POA: Diagnosis not present

## 2021-02-28 DIAGNOSIS — L299 Pruritus, unspecified: Secondary | ICD-10-CM | POA: Diagnosis not present

## 2021-02-28 DIAGNOSIS — E039 Hypothyroidism, unspecified: Secondary | ICD-10-CM | POA: Diagnosis not present

## 2021-03-07 DIAGNOSIS — E875 Hyperkalemia: Secondary | ICD-10-CM | POA: Diagnosis not present

## 2021-03-24 DIAGNOSIS — I129 Hypertensive chronic kidney disease with stage 1 through stage 4 chronic kidney disease, or unspecified chronic kidney disease: Secondary | ICD-10-CM | POA: Diagnosis not present

## 2021-03-24 DIAGNOSIS — X32XXXA Exposure to sunlight, initial encounter: Secondary | ICD-10-CM | POA: Diagnosis not present

## 2021-03-24 DIAGNOSIS — E039 Hypothyroidism, unspecified: Secondary | ICD-10-CM | POA: Diagnosis not present

## 2021-03-24 DIAGNOSIS — M150 Primary generalized (osteo)arthritis: Secondary | ICD-10-CM | POA: Diagnosis not present

## 2021-03-24 DIAGNOSIS — N183 Chronic kidney disease, stage 3 unspecified: Secondary | ICD-10-CM | POA: Diagnosis not present

## 2021-03-24 DIAGNOSIS — L570 Actinic keratosis: Secondary | ICD-10-CM | POA: Diagnosis not present

## 2021-03-24 DIAGNOSIS — L218 Other seborrheic dermatitis: Secondary | ICD-10-CM | POA: Diagnosis not present

## 2021-03-30 DIAGNOSIS — Z79891 Long term (current) use of opiate analgesic: Secondary | ICD-10-CM | POA: Diagnosis not present

## 2021-03-30 DIAGNOSIS — M5136 Other intervertebral disc degeneration, lumbar region: Secondary | ICD-10-CM | POA: Diagnosis not present

## 2021-03-30 DIAGNOSIS — G894 Chronic pain syndrome: Secondary | ICD-10-CM | POA: Diagnosis not present

## 2021-03-30 DIAGNOSIS — Z79899 Other long term (current) drug therapy: Secondary | ICD-10-CM | POA: Diagnosis not present

## 2021-03-30 DIAGNOSIS — M79652 Pain in left thigh: Secondary | ICD-10-CM | POA: Diagnosis not present

## 2021-03-30 DIAGNOSIS — M25552 Pain in left hip: Secondary | ICD-10-CM | POA: Diagnosis not present

## 2021-03-30 DIAGNOSIS — R1032 Left lower quadrant pain: Secondary | ICD-10-CM | POA: Diagnosis not present

## 2021-03-30 DIAGNOSIS — M47812 Spondylosis without myelopathy or radiculopathy, cervical region: Secondary | ICD-10-CM | POA: Diagnosis not present

## 2021-03-30 DIAGNOSIS — M542 Cervicalgia: Secondary | ICD-10-CM | POA: Diagnosis not present

## 2021-03-30 DIAGNOSIS — M47817 Spondylosis without myelopathy or radiculopathy, lumbosacral region: Secondary | ICD-10-CM | POA: Diagnosis not present

## 2021-04-24 DIAGNOSIS — M150 Primary generalized (osteo)arthritis: Secondary | ICD-10-CM | POA: Diagnosis not present

## 2021-04-24 DIAGNOSIS — E039 Hypothyroidism, unspecified: Secondary | ICD-10-CM | POA: Diagnosis not present

## 2021-04-24 DIAGNOSIS — N183 Chronic kidney disease, stage 3 unspecified: Secondary | ICD-10-CM | POA: Diagnosis not present

## 2021-04-24 DIAGNOSIS — I129 Hypertensive chronic kidney disease with stage 1 through stage 4 chronic kidney disease, or unspecified chronic kidney disease: Secondary | ICD-10-CM | POA: Diagnosis not present

## 2021-05-02 DIAGNOSIS — M79652 Pain in left thigh: Secondary | ICD-10-CM | POA: Diagnosis not present

## 2021-05-02 DIAGNOSIS — M5136 Other intervertebral disc degeneration, lumbar region: Secondary | ICD-10-CM | POA: Diagnosis not present

## 2021-05-02 DIAGNOSIS — M47817 Spondylosis without myelopathy or radiculopathy, lumbosacral region: Secondary | ICD-10-CM | POA: Diagnosis not present

## 2021-05-02 DIAGNOSIS — G894 Chronic pain syndrome: Secondary | ICD-10-CM | POA: Diagnosis not present

## 2021-05-23 DIAGNOSIS — Z23 Encounter for immunization: Secondary | ICD-10-CM | POA: Diagnosis not present

## 2021-05-23 DIAGNOSIS — I129 Hypertensive chronic kidney disease with stage 1 through stage 4 chronic kidney disease, or unspecified chronic kidney disease: Secondary | ICD-10-CM | POA: Diagnosis not present

## 2021-05-30 DIAGNOSIS — M79652 Pain in left thigh: Secondary | ICD-10-CM | POA: Diagnosis not present

## 2021-05-30 DIAGNOSIS — M47817 Spondylosis without myelopathy or radiculopathy, lumbosacral region: Secondary | ICD-10-CM | POA: Diagnosis not present

## 2021-05-30 DIAGNOSIS — M5136 Other intervertebral disc degeneration, lumbar region: Secondary | ICD-10-CM | POA: Diagnosis not present

## 2021-05-30 DIAGNOSIS — G894 Chronic pain syndrome: Secondary | ICD-10-CM | POA: Diagnosis not present

## 2021-06-29 DIAGNOSIS — M47812 Spondylosis without myelopathy or radiculopathy, cervical region: Secondary | ICD-10-CM | POA: Diagnosis not present

## 2021-06-29 DIAGNOSIS — M5136 Other intervertebral disc degeneration, lumbar region: Secondary | ICD-10-CM | POA: Diagnosis not present

## 2021-06-29 DIAGNOSIS — M961 Postlaminectomy syndrome, not elsewhere classified: Secondary | ICD-10-CM | POA: Diagnosis not present

## 2021-06-29 DIAGNOSIS — M47817 Spondylosis without myelopathy or radiculopathy, lumbosacral region: Secondary | ICD-10-CM | POA: Diagnosis not present

## 2021-06-30 DIAGNOSIS — J069 Acute upper respiratory infection, unspecified: Secondary | ICD-10-CM | POA: Diagnosis not present

## 2021-08-15 DIAGNOSIS — M47812 Spondylosis without myelopathy or radiculopathy, cervical region: Secondary | ICD-10-CM | POA: Diagnosis not present

## 2021-08-15 DIAGNOSIS — G894 Chronic pain syndrome: Secondary | ICD-10-CM | POA: Diagnosis not present

## 2021-08-15 DIAGNOSIS — M791 Myalgia, unspecified site: Secondary | ICD-10-CM | POA: Diagnosis not present

## 2021-08-15 DIAGNOSIS — Z79891 Long term (current) use of opiate analgesic: Secondary | ICD-10-CM | POA: Diagnosis not present

## 2021-09-04 DIAGNOSIS — I10 Essential (primary) hypertension: Secondary | ICD-10-CM | POA: Diagnosis not present

## 2021-09-04 DIAGNOSIS — E039 Hypothyroidism, unspecified: Secondary | ICD-10-CM | POA: Diagnosis not present

## 2021-09-04 DIAGNOSIS — Z79899 Other long term (current) drug therapy: Secondary | ICD-10-CM | POA: Diagnosis not present

## 2021-09-04 DIAGNOSIS — Z0000 Encounter for general adult medical examination without abnormal findings: Secondary | ICD-10-CM | POA: Diagnosis not present

## 2021-09-05 ENCOUNTER — Other Ambulatory Visit: Payer: Self-pay | Admitting: Internal Medicine

## 2021-09-05 DIAGNOSIS — R748 Abnormal levels of other serum enzymes: Secondary | ICD-10-CM

## 2021-09-07 DIAGNOSIS — Z8619 Personal history of other infectious and parasitic diseases: Secondary | ICD-10-CM | POA: Diagnosis not present

## 2021-09-07 DIAGNOSIS — N183 Chronic kidney disease, stage 3 unspecified: Secondary | ICD-10-CM | POA: Diagnosis not present

## 2021-09-07 DIAGNOSIS — J0100 Acute maxillary sinusitis, unspecified: Secondary | ICD-10-CM | POA: Diagnosis not present

## 2021-09-07 DIAGNOSIS — R748 Abnormal levels of other serum enzymes: Secondary | ICD-10-CM | POA: Diagnosis not present

## 2021-09-07 DIAGNOSIS — R749 Abnormal serum enzyme level, unspecified: Secondary | ICD-10-CM | POA: Diagnosis not present

## 2021-09-13 DIAGNOSIS — G894 Chronic pain syndrome: Secondary | ICD-10-CM | POA: Diagnosis not present

## 2021-09-13 DIAGNOSIS — M47812 Spondylosis without myelopathy or radiculopathy, cervical region: Secondary | ICD-10-CM | POA: Diagnosis not present

## 2021-09-13 DIAGNOSIS — M791 Myalgia, unspecified site: Secondary | ICD-10-CM | POA: Diagnosis not present

## 2021-09-20 DIAGNOSIS — R899 Unspecified abnormal finding in specimens from other organs, systems and tissues: Secondary | ICD-10-CM | POA: Diagnosis not present

## 2021-10-03 ENCOUNTER — Other Ambulatory Visit: Payer: Self-pay | Admitting: Nurse Practitioner

## 2021-10-03 ENCOUNTER — Ambulatory Visit
Admission: RE | Admit: 2021-10-03 | Discharge: 2021-10-03 | Disposition: A | Discharge status: Home / Self Care | Payer: Medicare Other | Source: Ambulatory Visit | Attending: Nurse Practitioner | Admitting: Nurse Practitioner

## 2021-10-03 DIAGNOSIS — M47897 Other spondylosis, lumbosacral region: Secondary | ICD-10-CM

## 2021-10-03 DIAGNOSIS — M5137 Other intervertebral disc degeneration, lumbosacral region: Secondary | ICD-10-CM | POA: Diagnosis not present

## 2021-10-03 DIAGNOSIS — M2578 Osteophyte, vertebrae: Secondary | ICD-10-CM | POA: Diagnosis not present

## 2021-10-03 DIAGNOSIS — M4316 Spondylolisthesis, lumbar region: Secondary | ICD-10-CM | POA: Diagnosis not present

## 2021-10-03 DIAGNOSIS — M5136 Other intervertebral disc degeneration, lumbar region: Secondary | ICD-10-CM | POA: Diagnosis not present

## 2021-10-11 DIAGNOSIS — M5136 Other intervertebral disc degeneration, lumbar region: Secondary | ICD-10-CM | POA: Diagnosis not present

## 2021-11-24 ENCOUNTER — Other Ambulatory Visit: Payer: Self-pay | Admitting: Internal Medicine

## 2021-11-24 DIAGNOSIS — Z1231 Encounter for screening mammogram for malignant neoplasm of breast: Secondary | ICD-10-CM

## 2021-12-18 ENCOUNTER — Ambulatory Visit
Admission: RE | Admit: 2021-12-18 | Discharge: 2021-12-18 | Disposition: A | Discharge status: Home / Self Care | Payer: Medicare Other | Source: Ambulatory Visit | Attending: Internal Medicine | Admitting: Internal Medicine

## 2021-12-18 DIAGNOSIS — Z1231 Encounter for screening mammogram for malignant neoplasm of breast: Secondary | ICD-10-CM | POA: Diagnosis not present

## 2022-02-02 DIAGNOSIS — E559 Vitamin D deficiency, unspecified: Secondary | ICD-10-CM | POA: Diagnosis not present

## 2022-02-02 DIAGNOSIS — E039 Hypothyroidism, unspecified: Secondary | ICD-10-CM | POA: Diagnosis not present

## 2022-02-02 DIAGNOSIS — E538 Deficiency of other specified B group vitamins: Secondary | ICD-10-CM | POA: Diagnosis not present

## 2022-02-27 DIAGNOSIS — E538 Deficiency of other specified B group vitamins: Secondary | ICD-10-CM | POA: Diagnosis not present

## 2022-03-21 DIAGNOSIS — K625 Hemorrhage of anus and rectum: Secondary | ICD-10-CM | POA: Diagnosis not present

## 2022-03-21 DIAGNOSIS — R197 Diarrhea, unspecified: Secondary | ICD-10-CM | POA: Diagnosis not present

## 2022-03-21 DIAGNOSIS — R112 Nausea with vomiting, unspecified: Secondary | ICD-10-CM | POA: Diagnosis not present

## 2022-03-22 DIAGNOSIS — K6389 Other specified diseases of intestine: Secondary | ICD-10-CM | POA: Diagnosis not present

## 2022-03-22 DIAGNOSIS — K633 Ulcer of intestine: Secondary | ICD-10-CM | POA: Diagnosis not present

## 2022-03-22 DIAGNOSIS — K625 Hemorrhage of anus and rectum: Secondary | ICD-10-CM | POA: Diagnosis not present

## 2022-03-22 DIAGNOSIS — R197 Diarrhea, unspecified: Secondary | ICD-10-CM | POA: Diagnosis not present

## 2022-03-22 DIAGNOSIS — K5730 Diverticulosis of large intestine without perforation or abscess without bleeding: Secondary | ICD-10-CM | POA: Diagnosis not present

## 2022-03-22 DIAGNOSIS — K559 Vascular disorder of intestine, unspecified: Secondary | ICD-10-CM | POA: Diagnosis not present

## 2022-03-22 DIAGNOSIS — K921 Melena: Secondary | ICD-10-CM | POA: Diagnosis not present

## 2022-03-28 DIAGNOSIS — E538 Deficiency of other specified B group vitamins: Secondary | ICD-10-CM | POA: Diagnosis not present

## 2022-03-28 DIAGNOSIS — E039 Hypothyroidism, unspecified: Secondary | ICD-10-CM | POA: Diagnosis not present

## 2022-03-28 DIAGNOSIS — E559 Vitamin D deficiency, unspecified: Secondary | ICD-10-CM | POA: Diagnosis not present

## 2022-04-04 DIAGNOSIS — E559 Vitamin D deficiency, unspecified: Secondary | ICD-10-CM | POA: Diagnosis not present

## 2022-04-04 DIAGNOSIS — Z23 Encounter for immunization: Secondary | ICD-10-CM | POA: Diagnosis not present

## 2022-04-04 DIAGNOSIS — I10 Essential (primary) hypertension: Secondary | ICD-10-CM | POA: Diagnosis not present

## 2022-04-04 DIAGNOSIS — Z0000 Encounter for general adult medical examination without abnormal findings: Secondary | ICD-10-CM | POA: Diagnosis not present

## 2022-04-04 DIAGNOSIS — K529 Noninfective gastroenteritis and colitis, unspecified: Secondary | ICD-10-CM | POA: Diagnosis not present

## 2022-04-04 DIAGNOSIS — E039 Hypothyroidism, unspecified: Secondary | ICD-10-CM | POA: Diagnosis not present

## 2022-04-04 DIAGNOSIS — L219 Seborrheic dermatitis, unspecified: Secondary | ICD-10-CM | POA: Diagnosis not present

## 2022-04-04 DIAGNOSIS — K625 Hemorrhage of anus and rectum: Secondary | ICD-10-CM | POA: Diagnosis not present

## 2022-04-18 DIAGNOSIS — K559 Vascular disorder of intestine, unspecified: Secondary | ICD-10-CM | POA: Diagnosis not present

## 2022-08-07 DIAGNOSIS — N6324 Unspecified lump in the left breast, lower inner quadrant: Secondary | ICD-10-CM | POA: Diagnosis not present

## 2022-08-07 DIAGNOSIS — N644 Mastodynia: Secondary | ICD-10-CM | POA: Diagnosis not present

## 2022-08-08 ENCOUNTER — Other Ambulatory Visit: Payer: Self-pay | Admitting: Registered Nurse

## 2022-08-08 DIAGNOSIS — N63 Unspecified lump in unspecified breast: Secondary | ICD-10-CM

## 2022-08-08 DIAGNOSIS — N644 Mastodynia: Secondary | ICD-10-CM

## 2022-08-17 ENCOUNTER — Ambulatory Visit
Admission: RE | Admit: 2022-08-17 | Discharge: 2022-08-17 | Disposition: A | Discharge status: Home / Self Care | Payer: Medicare Other | Source: Ambulatory Visit | Attending: Registered Nurse | Admitting: Registered Nurse

## 2022-08-17 DIAGNOSIS — N63 Unspecified lump in unspecified breast: Secondary | ICD-10-CM

## 2022-08-17 DIAGNOSIS — N644 Mastodynia: Secondary | ICD-10-CM

## 2022-09-03 ENCOUNTER — Emergency Department (HOSPITAL_COMMUNITY): Payer: Medicare Other

## 2022-09-03 ENCOUNTER — Other Ambulatory Visit: Payer: Self-pay

## 2022-09-03 ENCOUNTER — Emergency Department (HOSPITAL_COMMUNITY)
Admission: EM | Admit: 2022-09-03 | Discharge: 2022-09-03 | Disposition: A | Discharge status: Home / Self Care | Payer: Medicare Other | Attending: Emergency Medicine | Admitting: Emergency Medicine

## 2022-09-03 ENCOUNTER — Encounter (HOSPITAL_COMMUNITY): Payer: Self-pay | Admitting: Emergency Medicine

## 2022-09-03 DIAGNOSIS — M5412 Radiculopathy, cervical region: Secondary | ICD-10-CM

## 2022-09-03 DIAGNOSIS — Z79899 Other long term (current) drug therapy: Secondary | ICD-10-CM | POA: Insufficient documentation

## 2022-09-03 DIAGNOSIS — I10 Essential (primary) hypertension: Secondary | ICD-10-CM | POA: Diagnosis not present

## 2022-09-03 DIAGNOSIS — R42 Dizziness and giddiness: Secondary | ICD-10-CM | POA: Insufficient documentation

## 2022-09-03 DIAGNOSIS — M25512 Pain in left shoulder: Secondary | ICD-10-CM | POA: Insufficient documentation

## 2022-09-03 DIAGNOSIS — R079 Chest pain, unspecified: Secondary | ICD-10-CM | POA: Diagnosis not present

## 2022-09-03 LAB — BASIC METABOLIC PANEL
Anion gap: 6 (ref 5–15)
BUN: 23 mg/dL (ref 8–23)
CO2: 27 mmol/L (ref 22–32)
Calcium: 9.4 mg/dL (ref 8.9–10.3)
Chloride: 103 mmol/L (ref 98–111)
Creatinine, Ser: 1.29 mg/dL — ABNORMAL HIGH (ref 0.44–1.00)
GFR, Estimated: 45 mL/min — ABNORMAL LOW (ref >60)
Glucose, Bld: 142 mg/dL — ABNORMAL HIGH (ref 70–99)
Potassium: 4.4 mmol/L (ref 3.5–5.1)
Sodium: 136 mmol/L (ref 135–145)

## 2022-09-03 LAB — CBC
HCT: 40.2 % (ref 36.0–46.0)
Hemoglobin: 12.9 g/dL (ref 12.0–15.0)
MCH: 29.6 pg (ref 26.0–34.0)
MCHC: 32.1 g/dL (ref 30.0–36.0)
MCV: 92.2 fL (ref 80.0–100.0)
Platelets: 140 10*3/uL — ABNORMAL LOW (ref 150–400)
RBC: 4.36 MIL/uL (ref 3.87–5.11)
RDW: 13.2 % (ref 11.5–15.5)
WBC: 6.1 10*3/uL (ref 4.0–10.5)
nRBC: 0 % (ref 0.0–0.2)

## 2022-09-03 LAB — TROPONIN I (HIGH SENSITIVITY)
Troponin I (High Sensitivity): 3 ng/L (ref <18)
Troponin I (High Sensitivity): 5 ng/L (ref <18)

## 2022-09-03 MED ORDER — DEXAMETHASONE SODIUM PHOSPHATE 10 MG/ML IJ SOLN
10.0000 mg | Freq: Once | INTRAMUSCULAR | Status: AC
  Administered 2022-09-03: 10 mg via INTRAVENOUS
  Filled 2022-09-03: qty 1

## 2022-09-03 MED ORDER — METHOCARBAMOL 1000 MG/10ML IJ SOLN: 500.0000 mg | Freq: Once | INTRAMUSCULAR | Status: DC

## 2022-09-03 MED ORDER — METHOCARBAMOL 500 MG PO TABS: 500.0000 mg | ORAL_TABLET | Freq: Three times a day (TID) | ORAL | 0 refills | Status: AC | PRN

## 2022-09-03 MED ORDER — FENTANYL CITRATE PF 50 MCG/ML IJ SOSY
50.0000 ug | PREFILLED_SYRINGE | Freq: Once | INTRAMUSCULAR | Status: AC
  Administered 2022-09-03: 50 ug via INTRAVENOUS
  Filled 2022-09-03: qty 1

## 2022-09-03 MED ORDER — METHOCARBAMOL 1000 MG/10ML IJ SOLN
500.0000 mg | Freq: Once | INTRAVENOUS | Status: AC
  Administered 2022-09-03: 500 mg via INTRAVENOUS
  Filled 2022-09-03: qty 500

## 2022-09-03 NOTE — ED Triage Notes (Signed)
Pt reports left shoulder pain while lying in a different bed than normal while on vacation last week. Pt reports room spinning sensation beginning last week as well. Pt c/o numbness in left arm and fingertips. Pt awake, alert, appropriate at present.

## 2022-09-03 NOTE — ED Provider Notes (Signed)
[facility] Provider Note   CSN: [id number] Arrival date & time: 09/03/22  1125     History {Add pertinent medical, surgical, social history, OB history to HPI:1} Chief Complaint  Patient presents with   Shoulder Pain   Dizziness    Shelley Lloyd is a 67 y.o. female.   Shoulder Pain Dizziness Patient presents with left shoulder/neck pain.  Has had for last 2 days.  Goes to left arm.  Has had history of 3 neck surgeries and does have pain this area at times.  No numbness or weakness in the arm but does have pain with movement.  No relief with over-the-counter medicines at home.  Also has some vertigo.  History of same.  States felt as if the room was spinning.  Worse with laying back.  No other numbness weakness.  No chest pain.    Past Medical History:  Diagnosis Date   Depression    Hypertension    Nerve compression    Vision abnormalities     Home Medications Prior to Admission medications   Medication Sig Start Date End Date Taking? Authorizing Provider  ARIPiprazole (ABILIFY) 15 MG tablet Take 15 mg by mouth at bedtime. 05/29/18   [provider]  citalopram (CELEXA) 40 MG tablet Take 40 mg by mouth daily. 06/24/18   [provider]  lisinopril (PRINIVIL,ZESTRIL) 10 MG tablet Take 10 mg by mouth daily.    [provider]  mirtazapine (REMERON) 15 MG tablet Take 15 mg by mouth at bedtime. 08/07/18   [provider]  oxybutynin (DITROPAN-XL) 10 MG 24 hr tablet Take 10 mg by mouth daily. 06/24/18   [provider]  oxycodone (OXY-IR) 5 MG capsule Take 10 mg by mouth every 4 (four) hours as needed for pain.     [provider]  SYNTHROID 88 MCG tablet Take 88 mcg by mouth daily. 06/30/18   [provider]  tiZANidine (ZANAFLEX) 4 MG tablet Take 4 mg by mouth 2 (two) times daily as needed. 04/08/18   [provider]  XTAMPZA ER 13.5 MG C12A Take 13.5 mg by mouth  every 12 (twelve) hours. 08/07/18   [provider]      Allergies    Lyrica [pregabalin]    Review of Systems   Review of Systems  Neurological:  Positive for dizziness.    Physical Exam Updated Vital Signs BP (!) 166/82   Pulse 66   Temp 97.7 F (36.5 C) (Oral)   Resp 16   Ht '5\' 6"'$  (1.676 m)   SpO2 100%   BMI 31.47 kg/m  Physical Exam Vitals and nursing note reviewed.  HENT:     Head: Atraumatic.  Eyes:     Extraocular Movements: Extraocular movements intact.     Pupils: Pupils are equal, round, and reactive to light.  Neck:     Comments: Scars from previous surgery. Pulmonary:     Breath sounds: No wheezing or rhonchi.  Musculoskeletal:        General: Tenderness present.     Cervical back: Neck supple. No tenderness.     Comments: Tenderness to muscular over left shoulder and left back over scapula area.  No skin changes.  Neurovascular intact in left upper extremity.  Pulse intact.  Neurological:     Mental Status: She is alert and oriented to person, place, and time.     ED Results / Procedures / Treatments   Labs (all  labs ordered are listed, but only abnormal results are displayed) Labs Reviewed  BASIC METABOLIC PANEL - Abnormal; Notable for the following components:      Result Value   Glucose, Bld 142 (*)    Creatinine, Ser 1.29 (*)    GFR, Estimated 45 (*)    All other components within normal limits  CBC - Abnormal; Notable for the following components:   Platelets 140 (*)    All other components within normal limits  TROPONIN I (HIGH SENSITIVITY)  TROPONIN I (HIGH SENSITIVITY)    EKG None  Radiology DG Chest 2 View  Result Date: 09/03/2022 CLINICAL DATA:  Chest pain EXAM: CHEST - 2 VIEW COMPARISON:  08/19/2018 FINDINGS: The heart size and mediastinal contours are within normal limits. Both lungs are clear. The visualized skeletal structures are unremarkable. IMPRESSION: No acute abnormality of the lungs. Electronically Signed   By:  Delanna Ahmadi M.D.   On: 09/03/2022 12:30    Procedures Procedures  {Document cardiac monitor, telemetry assessment procedure when appropriate:1}  Medications Ordered in ED Medications  fentaNYL (SUBLIMAZE) injection 50 mcg (50 mcg Intravenous Given 09/03/22 1352)  methocarbamol (ROBAXIN) 500 mg in dextrose 5 % 50 mL IVPB (500 mg Intravenous New Bag/Given 09/03/22 1352)    ED Course/ Medical Decision Making/ A&P   {   Click here for ABCD2, HEART and other calculatorsREFRESH Note before signing :1}                          Medical Decision Making Amount and/or Complexity of Data Reviewed Labs: ordered. Radiology: ordered.  Risk Prescription drug management.   Patient with dizziness.  Sounds vertiginous with the room spinning.  I think more likely peripheral.  Has had peripheral vertigo in the past. However does have shoulder pain.  Goes down the arm.  States she had been sleeping in a strange bed.  Musculoskeletal tenderness in the area.  Will treat symptomatically and evaluate for response.  {Document critical care time when appropriate:1} {Document review of labs and clinical decision tools ie heart score, Chads2Vasc2 etc:1}  {Document your independent review of radiology images, and any outside records:1} {Document your discussion with family members, caretakers, and with consultants:1} {Document social determinants of health affecting pt's care:1} {Document your decision making why or why not admission, treatments were needed:1} Final Clinical Impression(s) / ED Diagnoses Final diagnoses:  None    Rx / DC Orders ED Discharge Orders     None

## 2022-09-03 NOTE — Discharge Instructions (Addendum)
Follow-up with neurosurgery as needed for the neck and shoulder pain.  Follow-up with ENT as needed for the vertigo.  Return for any developing numbness weakness.

## 2022-09-03 NOTE — ED Provider Triage Note (Signed)
Emergency Medicine Provider Triage Evaluation Note  Shelley Lloyd , a 67 y.o. female  was evaluated in triage.  Pt complains of left arm pain neck pain and numb fingers.   Review of Systems  Positive: dizziness Negative:   Physical Exam  BP (!) 166/82   Pulse 66   Temp 97.7 F (36.5 C) (Oral)   Resp 16   Ht '5\' 6"'$  (1.676 m)   SpO2 100%   BMI 31.47 kg/m  Gen:   Awake, no distress   Resp:  Normal effort  MSK:   Moves extremities without difficulty  Other:    Medical Decision Making  Medically screening exam initiated at 11:44 AM.  Appropriate orders placed.  Corey Harold was informed that the remainder of the evaluation will be completed by another provider, this initial triage assessment does not replace that evaluation, and the importance of remaining in the ED until their evaluation is complete.     Fransico Meadow, [place] 09/03/22 1459

## 2022-09-10 DIAGNOSIS — R42 Dizziness and giddiness: Secondary | ICD-10-CM | POA: Diagnosis not present

## 2022-09-10 DIAGNOSIS — M79602 Pain in left arm: Secondary | ICD-10-CM | POA: Diagnosis not present

## 2022-09-10 DIAGNOSIS — R29898 Other symptoms and signs involving the musculoskeletal system: Secondary | ICD-10-CM | POA: Diagnosis not present

## 2022-09-12 ENCOUNTER — Other Ambulatory Visit: Payer: Self-pay | Admitting: Registered Nurse

## 2022-09-12 DIAGNOSIS — R29898 Other symptoms and signs involving the musculoskeletal system: Secondary | ICD-10-CM

## 2022-10-02 ENCOUNTER — Ambulatory Visit
Admission: RE | Admit: 2022-10-02 | Discharge: 2022-10-02 | Disposition: A | Discharge status: Home / Self Care | Payer: Medicare Other | Source: Ambulatory Visit | Attending: Registered Nurse | Admitting: Registered Nurse

## 2022-10-02 DIAGNOSIS — M4802 Spinal stenosis, cervical region: Secondary | ICD-10-CM | POA: Diagnosis not present

## 2022-10-02 DIAGNOSIS — R29898 Other symptoms and signs involving the musculoskeletal system: Secondary | ICD-10-CM

## 2022-10-03 DIAGNOSIS — H6123 Impacted cerumen, bilateral: Secondary | ICD-10-CM | POA: Diagnosis not present

## 2022-10-03 DIAGNOSIS — R42 Dizziness and giddiness: Secondary | ICD-10-CM | POA: Diagnosis not present

## 2022-10-03 DIAGNOSIS — H903 Sensorineural hearing loss, bilateral: Secondary | ICD-10-CM | POA: Diagnosis not present

## 2022-10-03 DIAGNOSIS — H93293 Other abnormal auditory perceptions, bilateral: Secondary | ICD-10-CM | POA: Diagnosis not present

## 2022-10-03 DIAGNOSIS — M4722 Other spondylosis with radiculopathy, cervical region: Secondary | ICD-10-CM | POA: Diagnosis not present

## 2022-10-17 DIAGNOSIS — M542 Cervicalgia: Secondary | ICD-10-CM | POA: Diagnosis not present

## 2022-11-05 ENCOUNTER — Other Ambulatory Visit: Payer: Self-pay | Admitting: Internal Medicine

## 2022-11-05 DIAGNOSIS — Z0000 Encounter for general adult medical examination without abnormal findings: Secondary | ICD-10-CM

## 2022-12-17 DIAGNOSIS — H1045 Other chronic allergic conjunctivitis: Secondary | ICD-10-CM | POA: Diagnosis not present

## 2022-12-20 ENCOUNTER — Ambulatory Visit: Payer: Medicare Other

## 2022-12-22 ENCOUNTER — Ambulatory Visit
Admission: RE | Admit: 2022-12-22 | Discharge: 2022-12-22 | Disposition: A | Discharge status: Home / Self Care | Payer: Medicare Other | Source: Ambulatory Visit | Attending: Internal Medicine | Admitting: Internal Medicine

## 2022-12-22 ENCOUNTER — Ambulatory Visit (INDEPENDENT_AMBULATORY_CARE_PROVIDER_SITE_OTHER): Payer: Medicare Other

## 2022-12-22 VITALS — BP 110/67 | HR 65 | Temp 98.1°F | Resp 20

## 2022-12-22 DIAGNOSIS — J018 Other acute sinusitis: Secondary | ICD-10-CM | POA: Insufficient documentation

## 2022-12-22 DIAGNOSIS — R051 Acute cough: Secondary | ICD-10-CM

## 2022-12-22 DIAGNOSIS — R059 Cough, unspecified: Secondary | ICD-10-CM | POA: Diagnosis not present

## 2022-12-22 DIAGNOSIS — J029 Acute pharyngitis, unspecified: Secondary | ICD-10-CM | POA: Insufficient documentation

## 2022-12-22 DIAGNOSIS — R519 Headache, unspecified: Secondary | ICD-10-CM | POA: Diagnosis not present

## 2022-12-22 DIAGNOSIS — R0989 Other specified symptoms and signs involving the circulatory and respiratory systems: Secondary | ICD-10-CM | POA: Diagnosis not present

## 2022-12-22 LAB — POCT RAPID STREP A (OFFICE): Rapid Strep A Screen: NEGATIVE

## 2022-12-22 MED ORDER — AMOXICILLIN-POT CLAVULANATE 875-125 MG PO TABS: 1.0000 | ORAL_TABLET | Freq: Two times a day (BID) | ORAL | 0 refills | Status: AC

## 2022-12-22 NOTE — ED Triage Notes (Signed)
Pt c/o sore throat for 14 days, headache, congestion, sore throat and low fever (99.6).  Home interventions: OTC cough/ cold medications

## 2022-12-22 NOTE — ED Provider Notes (Signed)
Shelley Lloyd    CSN: [id number] Arrival date & time: 12/22/22  1043      History   Chief Complaint Chief Complaint  Patient presents with   Sore Throat    Sore throat for 14 days. Headache. Congestion has basically passed but throat no better. Low fever 99.6 is highest it's been flu type symptoms. - Entered by patient   Nasal Congestion   Headache   Fever    HPI Shelley Lloyd is a 67 y.o. female.   Patient presents with sore throat, headache, nasal congestion, cough that all started about 2 weeks ago.  Patient reports that she has had some green nasal drainage but denies sinus pressure.  Cough is productive at times.  Tmax at home was 99.6.  Patient reports that she has taken several over-the-counter cold and flu medications with minimal improvement in symptoms.  Patient denies chest pain or shortness of breath   Sore Throat  Headache Fever   Past Medical History:  Diagnosis Date   Depression    Hypertension    Nerve compression    Vision abnormalities     Patient Active Problem List   Diagnosis Date Noted   Orthostatic hypotension 08/26/2018    Past Surgical History:  Procedure Laterality Date   CERVICAL FUSION     3 separate fusions starting at C4   REPLACEMENT TOTAL KNEE BILATERAL      OB History   No obstetric history on file.      Home Medications    Prior to Admission medications   Medication Sig Start Date End Date Taking? Authorizing Provider  amoxicillin-clavulanate (AUGMENTIN) 875-125 MG tablet Take 1 tablet by mouth every 12 (twelve) hours. 12/22/22  Yes Ervin Knack E, FNP  Cyanocobalamin 3000 MCG SUBL as directed Sublingual Once a day 02/02/22  Yes [provider]  ARIPiprazole (ABILIFY) 15 MG tablet Take 15 mg by mouth at bedtime. 05/29/18   [provider]  cholecalciferol (VITAMIN D3) 25 MCG (1000 UNIT) tablet 2 capsules.    [provider]  citalopram (CELEXA) 40 MG tablet Take 40 mg by mouth daily.  06/24/18   [provider]  lisinopril (PRINIVIL,ZESTRIL) 10 MG tablet Take 10 mg by mouth daily.    [provider]  methocarbamol (ROBAXIN) 500 MG tablet Take 1 tablet (500 mg total) by mouth every 8 (eight) hours as needed for muscle spasms. 09/03/22   Benjiman Core, MD  mirtazapine (REMERON) 15 MG tablet Take 15 mg by mouth at bedtime. 08/07/18   [provider]  oxybutynin (DITROPAN-XL) 10 MG 24 hr tablet Take 10 mg by mouth daily. 06/24/18   [provider]  oxycodone (OXY-IR) 5 MG capsule Take 10 mg by mouth every 4 (four) hours as needed for pain.     [provider]  SYNTHROID 88 MCG tablet Take 88 mcg by mouth daily. 06/30/18   [provider]  XTAMPZA ER 13.5 MG C12A Take 13.5 mg by mouth every 12 (twelve) hours. 08/07/18   [provider]    Family History Family History  Problem Relation Age of Onset   Breast cancer Paternal Aunt    Breast cancer Sister    Breast cancer Cousin        2 first cousins   Esophageal cancer Mother    Lung cancer Mother    COPD Father    Heart attack Brother     Social History Social History   Tobacco Use  Smoking status: Never   Smokeless tobacco: Never  Vaping Use   Vaping Use: Never used  Substance Use Topics   Alcohol use: Not Currently     Allergies   Lyrica [pregabalin]   Review of Systems Review of Systems Per HPI  Physical Exam Triage Vital Signs ED Triage Vitals  Enc Vitals Group     BP 12/22/22 1111 110/67     Pulse Rate 12/22/22 1111 65     Resp 12/22/22 1111 20     Temp 12/22/22 1111 98.1 F (36.7 C)     Temp Source 12/22/22 1111 Oral     SpO2 12/22/22 1111 95 %     Weight --      Height --      Head Circumference --      Peak Flow --      Pain Score 12/22/22 1119 8     Pain Loc --      Pain Edu? --      Excl. in GC? --    No data found.  Updated Vital Signs BP 110/67 (BP Location: Left Arm)   Pulse 65   Temp 98.1 F (36.7 C) (Oral)    Resp 20   SpO2 95%   Visual Acuity Right Eye Distance:   Left Eye Distance:   Bilateral Distance:    Right Eye Near:   Left Eye Near:    Bilateral Near:     Physical Exam Constitutional:      General: She is not in acute distress.    Appearance: Normal appearance. She is not toxic-appearing or diaphoretic.  HENT:     Head: Normocephalic and atraumatic.     Right Ear: Tympanic membrane and ear canal normal.     Left Ear: Tympanic membrane and ear canal normal.     Nose: Congestion present.     Mouth/Throat:     Mouth: Mucous membranes are moist.     Pharynx: Posterior oropharyngeal erythema present.  Eyes:     Extraocular Movements: Extraocular movements intact.     Conjunctiva/sclera: Conjunctivae normal.     Pupils: Pupils are equal, round, and reactive to light.  Cardiovascular:     Rate and Rhythm: Normal rate and regular rhythm.     Pulses: Normal pulses.     Heart sounds: Normal heart sounds.  Pulmonary:     Effort: Pulmonary effort is normal. No respiratory distress.     Breath sounds: Normal breath sounds. No wheezing.  Abdominal:     General: Abdomen is flat. Bowel sounds are normal.     Palpations: Abdomen is soft.  Musculoskeletal:        General: Normal range of motion.     Cervical back: Normal range of motion.  Skin:    General: Skin is warm and dry.  Neurological:     General: No focal deficit present.     Mental Status: She is alert and oriented to person, place, and time. Mental status is at baseline.  Psychiatric:        Mood and Affect: Mood normal.        Behavior: Behavior normal.      UC Treatments / Results  Labs (all labs ordered are listed, but only abnormal results are displayed) Labs Reviewed  CULTURE, GROUP A STREP [facility])  POCT RAPID STREP A (OFFICE)    EKG   Radiology DG Chest 2 View  Result Date: 12/22/2022 CLINICAL DATA:  Cough 2 weeks with sore throat, headache  and congestion. Low-grade fever. EXAM: CHEST - 2 VIEW  COMPARISON:  09/03/2022 FINDINGS: Lungs are adequately inflated and otherwise clear. Cardiomediastinal silhouette and remainder of the exam is unchanged. IMPRESSION: No active cardiopulmonary disease. Electronically Signed   By: Elberta Fortis M.D.   On: 12/22/2022 12:26    Procedures Procedures (including critical Lloyd time)  Medications Ordered in UC Medications - No data to display  Initial Impression / Assessment and Plan / UC Course  I have reviewed the triage vital signs and the nursing notes.  Pertinent labs & imaging results that were available during my Lloyd of the patient were reviewed by me and considered in my medical decision making (see chart for details).     Chest x-ray completed that was negative for any acute cardiopulmonary process.  Rapid strep is negative.  Throat culture pending.  Will defer viral testing given duration of symptoms as it would not change treatment.  No signs of peritonsillar abscess on exam.  Suspect upper respiratory/sinus infection given duration of symptoms and patient reporting green nasal discharge.  Will treat with Augmentin.  Creatinine clearance is 57 so no dosage adjustment necessary.  Advised her to follow-up if any symptoms persist or worsen.  Patient verbalized understanding and was agreeable with plan. Final Clinical Impressions(s) / UC Diagnoses   Final diagnoses:  Acute non-recurrent sinusitis of other sinus  Sore throat  Acute cough     Discharge Instructions      I have prescribed you an antibiotic to treat respiratory infection.  Follow-up if any symptoms persist or worsen.     ED Prescriptions     Medication Sig Dispense Auth. Provider   amoxicillin-clavulanate (AUGMENTIN) 875-125 MG tablet Take 1 tablet by mouth every 12 (twelve) hours. 14 tablet [place], Acie Fredrickson, [place]      PDMP not reviewed this encounter.   Gustavus Bryant, [place] 12/22/22 1248

## 2022-12-22 NOTE — Discharge Instructions (Signed)
I have prescribed you an antibiotic to treat respiratory infection.  Follow-up if any symptoms persist or worsen.

## 2022-12-24 LAB — CULTURE, GROUP A STREP (THRC)

## 2023-01-24 ENCOUNTER — Ambulatory Visit: Payer: Medicare Other

## 2023-01-31 ENCOUNTER — Ambulatory Visit: Payer: Medicare Other

## 2023-02-05 ENCOUNTER — Ambulatory Visit: Payer: Medicare Other

## 2023-02-20 ENCOUNTER — Ambulatory Visit: Payer: Medicare Other

## 2023-03-07 ENCOUNTER — Ambulatory Visit: Payer: Medicare Other

## 2023-03-28 ENCOUNTER — Ambulatory Visit
Admission: RE | Admit: 2023-03-28 | Discharge: 2023-03-28 | Disposition: A | Discharge status: Home / Self Care | Payer: Medicare Other | Source: Ambulatory Visit | Attending: Internal Medicine | Admitting: Internal Medicine

## 2023-03-28 DIAGNOSIS — Z0000 Encounter for general adult medical examination without abnormal findings: Secondary | ICD-10-CM

## 2023-03-28 DIAGNOSIS — Z1231 Encounter for screening mammogram for malignant neoplasm of breast: Secondary | ICD-10-CM | POA: Diagnosis not present

## 2023-04-02 ENCOUNTER — Other Ambulatory Visit: Payer: Self-pay | Admitting: Internal Medicine

## 2023-04-02 DIAGNOSIS — E039 Hypothyroidism, unspecified: Secondary | ICD-10-CM | POA: Diagnosis not present

## 2023-04-02 DIAGNOSIS — E7800 Pure hypercholesterolemia, unspecified: Secondary | ICD-10-CM | POA: Diagnosis not present

## 2023-04-02 DIAGNOSIS — E559 Vitamin D deficiency, unspecified: Secondary | ICD-10-CM | POA: Diagnosis not present

## 2023-04-02 DIAGNOSIS — I10 Essential (primary) hypertension: Secondary | ICD-10-CM | POA: Diagnosis not present

## 2023-04-02 DIAGNOSIS — E538 Deficiency of other specified B group vitamins: Secondary | ICD-10-CM | POA: Diagnosis not present

## 2023-04-02 DIAGNOSIS — R928 Other abnormal and inconclusive findings on diagnostic imaging of breast: Secondary | ICD-10-CM

## 2023-04-09 ENCOUNTER — Other Ambulatory Visit (HOSPITAL_COMMUNITY): Payer: Self-pay | Admitting: Registered Nurse

## 2023-04-09 DIAGNOSIS — I10 Essential (primary) hypertension: Secondary | ICD-10-CM

## 2023-04-09 DIAGNOSIS — E039 Hypothyroidism, unspecified: Secondary | ICD-10-CM | POA: Diagnosis not present

## 2023-04-09 DIAGNOSIS — Z23 Encounter for immunization: Secondary | ICD-10-CM | POA: Diagnosis not present

## 2023-04-09 DIAGNOSIS — E559 Vitamin D deficiency, unspecified: Secondary | ICD-10-CM | POA: Diagnosis not present

## 2023-04-09 DIAGNOSIS — Z0000 Encounter for general adult medical examination without abnormal findings: Secondary | ICD-10-CM | POA: Diagnosis not present

## 2023-04-15 ENCOUNTER — Other Ambulatory Visit: Payer: Medicare Other

## 2023-04-16 ENCOUNTER — Ambulatory Visit (HOSPITAL_COMMUNITY)
Admission: RE | Admit: 2023-04-16 | Discharge: 2023-04-16 | Disposition: A | Discharge status: Home / Self Care | Payer: Medicare Other | Source: Ambulatory Visit | Attending: Registered Nurse | Admitting: Registered Nurse

## 2023-04-16 DIAGNOSIS — I10 Essential (primary) hypertension: Secondary | ICD-10-CM | POA: Insufficient documentation

## 2023-05-02 ENCOUNTER — Ambulatory Visit: Payer: Medicare Other

## 2023-05-02 ENCOUNTER — Ambulatory Visit
Admission: RE | Admit: 2023-05-02 | Discharge: 2023-05-02 | Disposition: A | Discharge status: Home / Self Care | Payer: Medicare Other | Source: Ambulatory Visit | Attending: Internal Medicine | Admitting: Internal Medicine

## 2023-05-02 DIAGNOSIS — R928 Other abnormal and inconclusive findings on diagnostic imaging of breast: Secondary | ICD-10-CM | POA: Diagnosis not present

## 2024-01-14 DIAGNOSIS — F17211 Nicotine dependence, cigarettes, in remission: Secondary | ICD-10-CM | POA: Diagnosis not present

## 2024-01-14 DIAGNOSIS — Z6829 Body mass index (BMI) 29.0-29.9, adult: Secondary | ICD-10-CM | POA: Diagnosis not present

## 2024-01-14 DIAGNOSIS — E039 Hypothyroidism, unspecified: Secondary | ICD-10-CM | POA: Diagnosis not present

## 2024-01-14 DIAGNOSIS — I129 Hypertensive chronic kidney disease with stage 1 through stage 4 chronic kidney disease, or unspecified chronic kidney disease: Secondary | ICD-10-CM | POA: Diagnosis not present

## 2024-01-14 DIAGNOSIS — E663 Overweight: Secondary | ICD-10-CM | POA: Diagnosis not present

## 2024-01-14 DIAGNOSIS — Z008 Encounter for other general examination: Secondary | ICD-10-CM | POA: Diagnosis not present

## 2024-01-14 DIAGNOSIS — E785 Hyperlipidemia, unspecified: Secondary | ICD-10-CM | POA: Diagnosis not present

## 2024-01-14 DIAGNOSIS — N1831 Chronic kidney disease, stage 3a: Secondary | ICD-10-CM | POA: Diagnosis not present

## 2024-04-02 DIAGNOSIS — E039 Hypothyroidism, unspecified: Secondary | ICD-10-CM | POA: Diagnosis not present

## 2024-04-02 DIAGNOSIS — I10 Essential (primary) hypertension: Secondary | ICD-10-CM | POA: Diagnosis not present

## 2024-04-27 ENCOUNTER — Other Ambulatory Visit: Payer: Self-pay | Admitting: Internal Medicine

## 2024-04-27 DIAGNOSIS — Z1231 Encounter for screening mammogram for malignant neoplasm of breast: Secondary | ICD-10-CM

## 2024-05-15 ENCOUNTER — Ambulatory Visit
Admission: RE | Admit: 2024-05-15 | Discharge: 2024-05-15 | Disposition: A | Discharge status: Home / Self Care | Payer: PRIVATE HEALTH INSURANCE | Source: Ambulatory Visit | Attending: Internal Medicine | Admitting: Internal Medicine

## 2024-05-15 DIAGNOSIS — Z1231 Encounter for screening mammogram for malignant neoplasm of breast: Secondary | ICD-10-CM
